# Patient Record
Sex: Male | Born: 1960
Health system: Southern US, Community
[De-identification: ages and names within clinical notes are randomized; demographics above are authoritative.]

## PROBLEM LIST (undated history)

## (undated) DIAGNOSIS — E785 Hyperlipidemia, unspecified: Secondary | ICD-10-CM

## (undated) DIAGNOSIS — G2581 Restless legs syndrome: Secondary | ICD-10-CM

## (undated) DIAGNOSIS — I10 Essential (primary) hypertension: Secondary | ICD-10-CM

## (undated) DIAGNOSIS — E782 Mixed hyperlipidemia: Secondary | ICD-10-CM

## (undated) DIAGNOSIS — J342 Deviated nasal septum: Secondary | ICD-10-CM

## (undated) DIAGNOSIS — R51 Headache: Secondary | ICD-10-CM

## (undated) DIAGNOSIS — G4700 Insomnia, unspecified: Secondary | ICD-10-CM

## (undated) DIAGNOSIS — R351 Nocturia: Secondary | ICD-10-CM

## (undated) DIAGNOSIS — J329 Chronic sinusitis, unspecified: Secondary | ICD-10-CM

## (undated) DIAGNOSIS — E669 Obesity, unspecified: Secondary | ICD-10-CM

## (undated) DIAGNOSIS — F329 Major depressive disorder, single episode, unspecified: Secondary | ICD-10-CM

## (undated) DIAGNOSIS — R519 Headache, unspecified: Secondary | ICD-10-CM

## (undated) DIAGNOSIS — G4730 Sleep apnea, unspecified: Secondary | ICD-10-CM

## (undated) DIAGNOSIS — F32A Depression, unspecified: Secondary | ICD-10-CM

## (undated) DIAGNOSIS — K219 Gastro-esophageal reflux disease without esophagitis: Secondary | ICD-10-CM

## (undated) HISTORY — DX: Headache: R51

## (undated) HISTORY — PX: SPINE SURGERY: SHX786

## (undated) HISTORY — DX: Headache, unspecified: R51.9

## (undated) HISTORY — DX: Depression, unspecified: F32.A

## (undated) HISTORY — DX: Chronic sinusitis, unspecified: J32.9

## (undated) HISTORY — DX: Essential (primary) hypertension: I10

## (undated) HISTORY — DX: Gastro-esophageal reflux disease without esophagitis: K21.9

## (undated) HISTORY — PX: NASAL SINUS SURGERY: SHX719

## (undated) HISTORY — DX: Sleep apnea, unspecified: G47.30

## (undated) HISTORY — DX: Deviated nasal septum: J34.2

## (undated) HISTORY — DX: Obesity, unspecified: E66.9

## (undated) HISTORY — DX: Mixed hyperlipidemia: E78.2

## (undated) HISTORY — PX: CHOLECYSTECTOMY, LAPAROSCOPIC: SHX56

## (undated) HISTORY — PX: SEPTOPLASTY: SUR1290

## (undated) HISTORY — PX: WISDOM TOOTH EXTRACTION: SHX21

## (undated) HISTORY — DX: Restless legs syndrome: G25.81

## (undated) HISTORY — DX: Nocturia: R35.1

## (undated) HISTORY — PX: BACK SURGERY: SHX140

## (undated) HISTORY — DX: Hyperlipidemia, unspecified: E78.5

## (undated) HISTORY — DX: Insomnia, unspecified: G47.00

---
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
## 2014-05-12 DIAGNOSIS — M545 Low back pain, unspecified: Secondary | ICD-10-CM | POA: Insufficient documentation

## 2014-05-12 DIAGNOSIS — I10 Essential (primary) hypertension: Secondary | ICD-10-CM | POA: Insufficient documentation

## 2014-06-19 ENCOUNTER — Encounter: Payer: Self-pay | Admitting: *Deleted

## 2014-06-19 ENCOUNTER — Encounter: Payer: Self-pay | Admitting: Neurology

## 2014-06-19 ENCOUNTER — Ambulatory Visit (INDEPENDENT_AMBULATORY_CARE_PROVIDER_SITE_OTHER): Payer: Federal, State, Local not specified - PPO | Admitting: Neurology

## 2014-06-19 VITALS — BP 134/95 | HR 73 | Ht 73.25 in | Wt 248.0 lb

## 2014-06-19 DIAGNOSIS — T3995XA Adverse effect of unspecified nonopioid analgesic, antipyretic and antirheumatic, initial encounter: Secondary | ICD-10-CM

## 2014-06-19 DIAGNOSIS — G43709 Chronic migraine without aura, not intractable, without status migrainosus: Secondary | ICD-10-CM | POA: Insufficient documentation

## 2014-06-19 DIAGNOSIS — G4730 Sleep apnea, unspecified: Secondary | ICD-10-CM

## 2014-06-19 DIAGNOSIS — G4440 Drug-induced headache, not elsewhere classified, not intractable: Secondary | ICD-10-CM | POA: Insufficient documentation

## 2014-06-19 DIAGNOSIS — R51 Headache: Secondary | ICD-10-CM

## 2014-06-19 DIAGNOSIS — R519 Headache, unspecified: Secondary | ICD-10-CM | POA: Insufficient documentation

## 2014-06-19 MED ORDER — DIVALPROEX SODIUM ER 500 MG PO TB24: 500.0000 mg | ORAL_TABLET | Freq: Every day | ORAL | Status: DC

## 2014-06-19 MED ORDER — TRAMADOL HCL 50 MG PO TABS: 100.0000 mg | ORAL_TABLET | Freq: Four times a day (QID) | ORAL | Status: DC | PRN

## 2014-06-19 NOTE — Patient Instructions (Addendum)
I had a long discussion with the patient regarding his chronic daily headaches and we discussed ianalgesic rebound headache and I recommend that he discontinue Goody powder, migraine Excedrin, Tylenol and all over-the-counter 96. Start Depakote ER 500 mg daily for headache prophylaxis and tramadol 100 mg every 6 hourly as needed for symptomatic relief but limited to not more than 3 days per week.I have also advised him to do regular neck stretching exercises and to participate in regular activities for stress relaxation. Check MRI scan of the brain and MRA of the brain for structural or vascular causes and polysomnogram for sleep apnea. Return for follow-up in 2 months or call earlier if necessary  Analgesic Rebound Headaches An analgesic rebound headache is a headache that returns after pain medicine (analgesic) that was taken to treat the initial headache wears off. People who suffer from tension, migraine, or cluster headaches are at risk for developing rebound headaches. Any type of primary headache can return as a rebound headache if you regularly take analgesics more than three times a week. If the cycle of rebound headaches continues, they become chronic daily headaches.  CAUSES Analgesics frequently associated with this problem include common over-the-counter medicines like aspirin, ibuprofen, acetaminophen, sinus relief medicines, and other medicines that contain caffeine. Narcotic pain medicines are also a common cause of rebound headaches.  SIGNS AND SYMPTOMS The symptoms of rebound headaches are the same as the symptoms of your initial headache. Symptoms of specific types of headaches include: Tension headache  Pressure around the head.  Dull, aching head pain.  Pain felt over the front and sides of the head.  Tenderness in the muscles of the head, neck and shoulders. Migraine Headache  Pulsing or throbbing pain on one or both sides of the head.  Severe pain that interferes with  daily activities.  Pain that is worsened by physical activity.  Nausea, vomiting, or both.  Pain with exposure to bright light, loud noises, or strong smells.  General sensitivity to bright light, loud noises, or strong smells.  Visual changes.  Numbness of one or both arms. Cluster Headaches  Severe pain that begins in or around one eye or temple.  Redness in the eye on the same side as the pain.  Droopy or swollen eyelid.  One-sided head pain.  Nausea.  Runny nose.  Sweaty, pale facial skin.  Restlessness. DIAGNOSIS  Analgesic rebound headaches are diagnosed by reviewing your medical history. This includes the nature of your initial headaches, as well as the type of pain medicines you have been using to treat your headaches and how often you take them. TREATMENT Discontinuing frequent use of the analgesic medicine will typically reduce the frequency of the rebound episodes. This may initially worsen your headaches but eventually the pain should become more manageable, less frequent, and less severe.  Seeing a headache specialists may helpful. He or she may be able to help you manage your headaches and to make sure there is not another cause of the headaches. Alternative methods of stress relief such as acupuncture, counseling, biofeedback, and massage may also be helpful. Talk with your health care provider about which alternative treatments might be good for you. HOME CARE INSTRUCTIONS Stopping the regular use of pain medicine can be difficult. Follow your health care provider's instructions carefully. Keep all of your appointments. Avoid triggers that are known to cause your primary headaches. SEEK MEDICAL CARE IF: You continue to experience headaches after following your health care provider's recommended treatments. SEEK IMMEDIATE  MEDICAL CARE IF:  You develop new headache pain.  You develop headache pain that is different than what you have experienced in the  past.  You develop numbness or tingling in your arms or legs.  You develop changes in your speech or vision. MAKE SURE YOU:  Understand these instructions.  Will watch your child's condition.  Will get help right away if your child is not doing well or gets worse. Document Released: 10/14/2003 Document Revised: 12/08/2013 Document Reviewed: 02/06/2013 [facility] Patient Information 2015 [place], [place]. This information is not intended to replace advice given to you by your health care provider. Make sure you discuss any questions you have with your health care provider.

## 2014-06-19 NOTE — Progress Notes (Signed)
[facility] [address] [place]. [place] [id number] [phone]       OFFICE CONSULT NOTE  Mr. Bryan DysDavid R Ochoa Date of Birth:  1960/10/21 Medical Record Number:  [id number]   Referring MD:  Starleen ArmsJyoti Mann  Reason for Referral:  headaches  HPI: [id number] year Caucasian male who states he is having headaches most of his life. He can remember schooldays when he is to throw up feels sick for most of the day and use to miss a lot of school and stay at home. He states his family was poor and they could not afford to see a doctor. He describes headache as being stereotypical, severe in intensity,bitemporal, sharp throbbing a and ccompanied by nausea or vomiting. Headaches are relieved by lying down and sleeping and get worse with physical activity. He rarely sees spots in front of his eyes. He's had daily headaches for rales several years as far back as he can remember. He takes 5-7 tablets of Goody powders every day as well as 2-3 tablets of Tylenol for migraine Excedrin on most days. These relieved the headache only partially but it does not completely go away. He has never visited an urgent care or emergency room or seen a neurologist pain specialist for his headaches. He denies any family history of migraines. He denies focal neurological symptoms accompanying his headaches. He initially thought his headaches were due to sinus infection and in fact underwent sinus surgery 2 years ago which helped his allergies but has not helped his headaches. He has not undergone any brain imaging study or tried to counsel her other migraine specific drugs. He has never been on medications for headache prophylaxis. He does snore and states that he has restless sleep and is tired during the day. He has not been evaluated for sleep apnea.  ROS:   14 system review of systems is positive for headache, snoring, restless sleep, allergies, runny nose, anxiety, not enough sleep, sleepiness, insomnia  PMH:  Past  Medical History  Diagnosis Date  . HA (headache)   . Hypertension   . Insomnia   . Sinusitis     Social History:  History   Social History  . Marital Status: Married    Spouse Name: N/A    Number of Children: 2  . Years of Education: ASSOCIATES   Occupational History  . Not on file.   Social History Main Topics  . Smoking status: Former Smoker    Quit date: 06/12/2014  . Smokeless tobacco: Never Used  . Alcohol Use: No  . Drug Use: No  . Sexual Activity: Not on file   Other Topics Concern  . Not on file   Social History Narrative   Patient is married with 2 children.   Patient is right handed.   Patient has his Associates degree.   Patient drinks 2 cups daily.    Medications:   No current outpatient prescriptions on file prior to visit.   No current facility-administered medications on file prior to visit.    Allergies:   Allergies  Allergen Reactions  . Penicillins     Knots all over body.    Physical Exam General: obese middle-age Caucasian male, seated, in no evident distress Head: head normocephalic and atraumatic.   Neck: supple with no carotid or supraclavicular bruits Cardiovascular: regular rate and rhythm, no murmurs Musculoskeletal: no deformity Skin:  no rash/petichiae Vascular:  Normal pulses all extremities Filed Vitals:   06/19/14 1104  BP: 134/95  Pulse:  73    Neurologic Exam Mental Status: Awake and fully alert. Oriented to place and time. Recent and remote memory intact. Attention span, concentration and fund of knowledge appropriate. Mood and affect appropriate.  Cranial Nerves: Fundoscopic exam reveals sharp disc margins. Pupils equal, briskly reactive to light. Extraocular movements full without nystagmus. Visual fields full to confrontation. Hearing intact. Facial sensation intact. Face, tongue, palate moves normally and symmetrically.  Motor: Normal bulk and tone. Normal strength in all tested extremity muscles. Sensory.:  intact to touch , pinprick , position and vibratory sensation.  Coordination: Rapid alternating movements normal in all extremities. Finger-to-nose and heel-to-shin performed accurately bilaterally. Gait and Station: Arises from chair without difficulty. Stance is normal. Gait demonstrates normal stride length and balance . Able to heel, toe and tandem walk without difficulty.  Reflexes: 1+ and symmetric. Toes downgoing.     ASSESSMENT: [age] year Caucasian male with lifelong history of refractory chronic headaches likely transformed migraine headaches with analgesic rebound.    PLAN:  had a long discussion with the patient regarding his chronic daily headaches and we discussed ianalgesic rebound headache and I recommend that he discontinue Goody powder, migraine Excedrin, Tylenol and all over-the-counter 96. Start Depakote ER 500 mg daily for headache prophylaxis and tramadol 100 mg every 6 hourly as needed for symptomatic relief but limited to not more than 3 days per week. I have also advised him to do regular neck stretching exercises and to participate in regular activities for stress relaxation.Check MRI scan of the brain and MRA of the brain for structural or vascular causes and polysomnogram for sleep apnea. Return for follow-up in 2 months or call earlier if necessary   Note: This document was prepared with digital dictation and possible smart phrase technology. Any transcriptional errors that result from this process are unintentional.

## 2014-06-26 ENCOUNTER — Telehealth: Payer: Self-pay | Admitting: Neurology

## 2014-06-26 DIAGNOSIS — R0683 Snoring: Secondary | ICD-10-CM

## 2014-06-26 DIAGNOSIS — E669 Obesity, unspecified: Secondary | ICD-10-CM

## 2014-06-26 DIAGNOSIS — I639 Cerebral infarction, unspecified: Secondary | ICD-10-CM

## 2014-06-26 NOTE — Telephone Encounter (Signed)
Delia HeadyPramod Sethi, MD, refers patient for attended sleep study.  Height: 6'1.25"  Weight: 248lb  BMI: 32.48  Past Medical History:  HA (headache)    . Hypertension   . Insomnia   . Sinusitis      Sleep Symptoms: snoring, restless sleep, not enough sleep, sleepiness, insomnia   Epworth Score: Unable to reach patient received voicemail   Medication:  Cetirizine HCl (Tab) ZYRTEC 10 MG Take 10 mg by mouth daily.       Divalproex Sodium (Tablet SR 24 hr) DEPAKOTE ER 500 MG Take 1 tablet (500 mg total) by mouth daily.      Famotidine (Tab) PEPCID 20 MG Take 20 mg by mouth.      Losartan Potassium (Tab) COZAAR 50 MG Take 50 mg by mouth.      Multiple Vitamin (Tab) MULTI-VITAMINS  Take by mouth.      Omega-3 Fatty Acids (Cap) Omega-3 1000 MG Take by mouth.      Sertraline HCl (Tab) ZOLOFT 50 MG Take 50 mg by mouth.      TraMADol HCl (Tab) ULTRAM 50 MG Take 2 tablets (100 mg total) by mouth every 6 (six) hours as needed.      Ins: [employer]   Assessment & Plan:  [age] year Caucasian male with lifelong history of refractory chronic headaches likely transformed migraine headaches with analgesic rebound.    PLAN: had a long discussion with the patient regarding his chronic daily headaches and we discussed ianalgesic rebound headache and I recommend that he discontinue Goody powder, migraine Excedrin, Tylenol and all over-the-counter 96. Start Depakote ER 500 mg daily for headache prophylaxis and tramadol 100 mg every 6 hourly as needed for symptomatic relief but limited to not more than 3 days per week. I have also advised him to do regular neck stretching exercises and to participate in regular activities for stress relaxation.Check MRI scan of the brain and MRA of the brain for structural or vascular causes and polysomnogram for sleep apnea. Return for follow-up in 2 months or call earlier if necessary   Note: This document was prepared with digital dictation  and possible smart phrase technology. Any transcriptional errors that result from this process are unintentional.  Please review patient information and submit instructions for scheduling and orders for sleep technologist. Thank you.

## 2014-07-01 ENCOUNTER — Telehealth: Payer: Self-pay | Admitting: Neurology

## 2014-07-01 ENCOUNTER — Ambulatory Visit (INDEPENDENT_AMBULATORY_CARE_PROVIDER_SITE_OTHER): Payer: Self-pay

## 2014-07-01 DIAGNOSIS — R519 Headache, unspecified: Secondary | ICD-10-CM

## 2014-07-01 DIAGNOSIS — R51 Headache: Principal | ICD-10-CM

## 2014-07-01 DIAGNOSIS — Z0289 Encounter for other administrative examinations: Secondary | ICD-10-CM

## 2014-07-01 NOTE — Telephone Encounter (Signed)
pt had to r/s mri due to needing a metal screening for past metal in eyes, please order xray orbits for pt, he will be going to [facility] tues 07/07/14 for xray

## 2014-07-03 ENCOUNTER — Other Ambulatory Visit: Payer: Self-pay | Admitting: Neurology

## 2014-07-03 DIAGNOSIS — S0540XA Penetrating wound of orbit with or without foreign body, unspecified eye, initial encounter: Secondary | ICD-10-CM

## 2014-07-03 NOTE — Telephone Encounter (Signed)
Will order.

## 2014-07-08 ENCOUNTER — Other Ambulatory Visit: Payer: Self-pay

## 2014-07-08 ENCOUNTER — Ambulatory Visit
Admission: RE | Admit: 2014-07-08 | Discharge: 2014-07-08 | Disposition: A | Discharge status: Home / Self Care | Payer: Federal, State, Local not specified - PPO | Source: Ambulatory Visit | Attending: Neurology | Admitting: Neurology

## 2014-07-08 ENCOUNTER — Ambulatory Visit (INDEPENDENT_AMBULATORY_CARE_PROVIDER_SITE_OTHER): Payer: Federal, State, Local not specified - PPO

## 2014-07-08 ENCOUNTER — Other Ambulatory Visit: Payer: Self-pay | Admitting: Neurology

## 2014-07-08 DIAGNOSIS — Z77018 Contact with and (suspected) exposure to other hazardous metals: Secondary | ICD-10-CM

## 2014-07-08 DIAGNOSIS — R51 Headache: Secondary | ICD-10-CM

## 2014-07-08 MED ORDER — GADOPENTETATE DIMEGLUMINE 469.01 MG/ML IV SOLN: 20.0000 mL | Freq: Once | INTRAVENOUS | Status: AC | PRN

## 2014-07-15 ENCOUNTER — Encounter: Payer: Self-pay | Admitting: *Deleted

## 2014-09-03 ENCOUNTER — Other Ambulatory Visit: Payer: Self-pay | Admitting: Neurology

## 2014-09-11 ENCOUNTER — Telehealth: Payer: Self-pay | Admitting: *Deleted

## 2014-09-11 NOTE — Telephone Encounter (Signed)
Called patient and left voice message for patient to call back and r/s appointment for 09/23/14.

## 2014-09-14 ENCOUNTER — Telehealth: Payer: Self-pay | Admitting: Neurology

## 2014-09-14 NOTE — Telephone Encounter (Signed)
Pt was scheduled for a sleep study in January and canceled his appt via the automated system.  Despite our many attempts to call the patient and attempt to reschedule, he has not responded to phone calls nor messages left on voicemail for scheduling.  Will close this referral at this time.  The patient was advised that the referral could be reopened if he contacts our office.  The referring provider was notified.

## 2014-09-21 ENCOUNTER — Other Ambulatory Visit: Payer: Self-pay

## 2014-09-21 MED ORDER — TRAMADOL HCL 50 MG PO TABS: 100.0000 mg | ORAL_TABLET | Freq: Four times a day (QID) | ORAL | Status: DC | PRN

## 2014-09-22 ENCOUNTER — Ambulatory Visit (INDEPENDENT_AMBULATORY_CARE_PROVIDER_SITE_OTHER): Payer: Federal, State, Local not specified - PPO | Admitting: Neurology

## 2014-09-22 DIAGNOSIS — G479 Sleep disorder, unspecified: Secondary | ICD-10-CM

## 2014-09-22 DIAGNOSIS — I639 Cerebral infarction, unspecified: Secondary | ICD-10-CM

## 2014-09-22 DIAGNOSIS — R0683 Snoring: Secondary | ICD-10-CM

## 2014-09-22 DIAGNOSIS — E669 Obesity, unspecified: Secondary | ICD-10-CM

## 2014-09-22 NOTE — Sleep Study (Signed)
Please see the scanned sleep study interpretation located in the Media tab within the Chart Review section. °

## 2014-09-23 ENCOUNTER — Ambulatory Visit: Payer: Federal, State, Local not specified - PPO | Admitting: Neurology

## 2014-09-26 ENCOUNTER — Other Ambulatory Visit: Payer: Self-pay | Admitting: Neurology

## 2014-09-29 DIAGNOSIS — I639 Cerebral infarction, unspecified: Secondary | ICD-10-CM | POA: Insufficient documentation

## 2014-09-29 DIAGNOSIS — R0683 Snoring: Secondary | ICD-10-CM | POA: Insufficient documentation

## 2014-09-29 DIAGNOSIS — E669 Obesity, unspecified: Secondary | ICD-10-CM | POA: Insufficient documentation

## 2014-09-30 ENCOUNTER — Telehealth: Payer: Self-pay | Admitting: *Deleted

## 2014-09-30 ENCOUNTER — Encounter: Payer: Self-pay | Admitting: Neurology

## 2014-09-30 ENCOUNTER — Other Ambulatory Visit: Payer: Self-pay | Admitting: Neurology

## 2014-09-30 DIAGNOSIS — G4733 Obstructive sleep apnea (adult) (pediatric): Secondary | ICD-10-CM

## 2014-09-30 NOTE — Telephone Encounter (Signed)
The patient's wife returned my phone call and was provided the results of her patient's sleep study that revealed severe obstructive sleep apnea.  She was informed that treatment in the form of CPAP therapy was recommended for treatment.  She was in agreement and was [facility] for CP[facility] set up.  Patient is out of town until April 1st.  Dr. Delia HeadyPramod Sethi was routed a copy of the results.  The patient gave verbal permission to mail a copy of his test results.   Patient instructed to contact our office 6-8 weeks post set up to schedule a follow up appointment.

## 2014-10-01 ENCOUNTER — Encounter: Payer: Self-pay | Admitting: *Deleted

## 2014-10-05 NOTE — Telephone Encounter (Signed)
Patient called back and r/s to 12/16/14 at 2:30 pm.

## 2014-11-16 ENCOUNTER — Telehealth: Payer: Self-pay | Admitting: *Deleted

## 2014-11-16 NOTE — Telephone Encounter (Signed)
Patient has a recent diagnosis of OSA.  Was referred to [facility] for CPAP set up.  He has refused treatment per [facility].

## 2014-12-04 NOTE — Telephone Encounter (Signed)
We have a lot of [facility] patient refusing CPAP- is that cost related"/ wold be cheaper at another DME ?

## 2014-12-16 ENCOUNTER — Ambulatory Visit: Payer: Federal, State, Local not specified - PPO | Admitting: Neurology

## 2015-03-23 ENCOUNTER — Encounter: Payer: Self-pay | Admitting: Neurology

## 2015-03-23 ENCOUNTER — Ambulatory Visit (INDEPENDENT_AMBULATORY_CARE_PROVIDER_SITE_OTHER): Payer: Federal, State, Local not specified - PPO | Admitting: Neurology

## 2015-03-23 VITALS — BP 135/92 | HR 69 | Ht 72.0 in | Wt 259.8 lb

## 2015-03-23 DIAGNOSIS — G441 Vascular headache, not elsewhere classified: Secondary | ICD-10-CM

## 2015-03-23 MED ORDER — SERTRALINE HCL 100 MG PO TABS: 100.0000 mg | ORAL_TABLET | Freq: Every day | ORAL | Status: DC

## 2015-03-23 NOTE — Progress Notes (Signed)
[facility] [address] [place]. [place] [address] [phone]       OFFICE FOLLOW UP VISIT  NOTE  Bryan Ochoa Date of Birth:  [birth year] Medical Record Number:  [id number]   Referring MD:  Starleen Arms  Reason for Referral:  headaches  HPI: 37 year Caucasian male who states he is having headaches most of his life. He can remember schooldays when he is to throw up feels sick for most of the day and use to miss a lot of school and stay at home. He states his family was poor and they could not afford to see a doctor. He describes headache as being stereotypical, severe in intensity,bitemporal, sharp throbbing a and ccompanied by nausea or vomiting. Headaches are relieved by lying down and sleeping and get worse with physical activity. He rarely sees spots in front of his eyes. He's had daily headaches for rales several years as far back as he can remember. He takes 5-7 tablets of Goody powders every day as well as 2-3 tablets of Tylenol for migraine Excedrin on most days. These relieved the headache only partially but it does not completely go away. He has never visited an urgent care or emergency room or seen a neurologist pain specialist for his headaches. He denies any family history of migraines. He denies focal neurological symptoms accompanying his headaches. He initially thought his headaches were due to sinus infection and in fact underwent sinus surgery 2 years ago which helped his allergies but has not helped his headaches. He has not undergone any brain imaging study or tried to counsel her other migraine specific drugs. He has never been on medications for headache prophylaxis. He does snore and states that he has restless sleep and is tired during the day. He has not been evaluated for sleep apnea. Update 03/23/2015 : He returns for follow-up after initial consultation with me in November 2015. He reported significant improvement in his headaches after starting  Depakote and discontinuing his over-the-counter 106. The headaches are not down to 1 or 2 per week maximum. He takes tramadol which helps. He is taking Depakote ER 500 mg once a day. He underwent MRI scan of the brain on 07/08/14 which I personally reviewed which was unremarkable except for mild changes of  paranasal sinusitis. MRA of the brain showed no significant large vessel stenosis or aneurysms. Polysomnogram sleep study on 09/22/14 showed severe sleep apnea for which he has been started on CPAP. He was initially reluctant to use it but states her last several months he has been compliant. He has not been doing regular neck exercises and I have advised him. Patient has a long-standing history of anxiety and did well on Zoloft 50 mg for several years but in recent months he has noticed increased anxiety as well as agitation and even road rage. He is suggesting a dose increase might help him. ROS:   14 system review of systems is positive for headache,  bladder urgency, decreased urination, back pain, insomnia, apnea, agitation, anxiety, nervousness and all other systems negative PMH:  Past Medical History  Diagnosis Date  . HA (headache)   . Hypertension   . Insomnia   . Sinusitis     Social History:  Social History   Social History  . Marital Status: Married    Spouse Name: N/A  . Number of Children: 2  . Years of Education: ASSOCIATES   Occupational History  . Not on file.   Social  History Main Topics  . Smoking status: Former Smoker    Quit date: 06/12/2014  . Smokeless tobacco: Never Used  . Alcohol Use: No  . Drug Use: No  . Sexual Activity: Not on file   Other Topics Concern  . Not on file   Social History Narrative   Patient is married with 2 children.   Patient is right handed.   Patient has his Associates degree.   Patient drinks 2 cups daily.    Medications:   Current Outpatient Prescriptions on File Prior to Visit  Medication Sig Dispense Refill  .  cetirizine (ZYRTEC) 10 MG tablet Take 10 mg by mouth daily.    . divalproex (DEPAKOTE ER) 500 MG 24 hr tablet TAKE 1 TABLET BY MOUTH EVERY DAY 90 tablet 1  . famotidine (PEPCID) 20 MG tablet Take 20 mg by mouth.    . losartan (COZAAR) 50 MG tablet Take 50 mg by mouth.    . Multiple Vitamin (MULTI-VITAMINS) TABS Take by mouth.    . Omega-3 1000 MG CAPS Take by mouth.    . traMADol (ULTRAM) 50 MG tablet Take 2 tablets (100 mg total) by mouth every 6 (six) hours as needed. 30 tablet 1   No current facility-administered medications on file prior to visit.    Allergies:   Allergies  Allergen Reactions  . Penicillins     Knots all over body.    Physical Exam General: obese middle-age Caucasian male, seated, in no evident distress Head: head normocephalic and atraumatic.   Neck: supple with no carotid or supraclavicular bruits Cardiovascular: regular rate and rhythm, no murmurs Musculoskeletal: no deformity Skin:  no rash/petichiae Vascular:  Normal pulses all extremities Filed Vitals:   03/23/15 1456  BP: 135/92  Pulse: 69    Neurologic Exam Mental Status: Awake and fully alert. Oriented to place and time. Recent and remote memory intact. Attention span, concentration and fund of knowledge appropriate. Mood and affect appropriate.  Cranial Nerves: Fundoscopic exam not done .Marland Kitchen Pupils equal, briskly reactive to light. Extraocular movements full without nystagmus. Visual fields full to confrontation. Hearing intact. Facial sensation intact. Face, tongue, palate moves normally and symmetrically.  Motor: Normal bulk and tone. Normal strength in all tested extremity muscles. Sensory.: intact to touch , pinprick , position and vibratory sensation.  Coordination: Rapid alternating movements normal in all extremities. Finger-to-nose and heel-to-shin performed accurately bilaterally. Gait and Station: Arises from chair without difficulty. Stance is normal. Gait demonstrates normal stride length  and balance . Able to heel, toe and tandem walk without difficulty.  Reflexes: 1+ and symmetric. Toes downgoing.     ASSESSMENT: 89 year Caucasian male with lifelong history of refractory chronic headaches likely transformed migraine headaches with analgesic rebound who is doing a lot better on current regimen of Depakote and tramadol. Recent increase in anxiety symptoms.    PLAN:  I had a long discussion with the patient with regards to his headaches which appears significantly improved on the current medication regimen of Depakote ER 500 mg daily and tramadol for symptomatic relief. I personally reviewed MRI scan images and sleep study results with the patient and answered questions. I recommend he continued the present treatment plan as well as be compliant with his CPAP to treat his sleep apnea. Patient is complaining of increasing symptoms of anxiety and hence I recommend increasing Zoloft to 100 mg daily. He was advised to follow-up with his primary physician and seek further medication changes for anxiety if needed.  He was advised to return for follow-up in 3 months with Butch Penny, nurse practitioner or call earlier if necessary.  Delia Heady, MD  Note: This document was prepared with digital dictation and possible smart phrase technology. Any transcriptional errors that result from this process are unintentional.

## 2015-03-23 NOTE — Patient Instructions (Addendum)
I had a long discussion with the patient with regards to his headaches which appears significantly improved on the current medication regimen of Depakote ER 500 mg daily and tramadol for symptomatic relief. I personally reviewed MRI scan images and sleep study results with the patient and answered questions. I recommend he continued the present treatment plan as well as be compliant with his CPAP to treat his sleep apnea. Patient is complaining of increasing symptoms of anxiety and hence I recommend increasing Zoloft to 100 mg daily. He was advised to follow-up with his primary physician and seek further medication changes for anxiety if needed. He was advised to return for follow-up in 3 months with Butch Penny, nurse practitioner or call earlier if necessary.

## 2015-05-18 ENCOUNTER — Telehealth: Payer: Self-pay

## 2015-05-18 ENCOUNTER — Other Ambulatory Visit: Payer: Self-pay | Admitting: Neurology

## 2015-05-18 NOTE — Telephone Encounter (Signed)
Rx signed and faxed.

## 2015-05-18 NOTE — Telephone Encounter (Signed)
Rn call patient about his tramadol being ready at the pharmacy. Pts wife Stanton Kidney answer the phone who is on the [facility] form. Stanton Kidney stated they will pick up medication for his neadaches.

## 2015-05-26 ENCOUNTER — Other Ambulatory Visit: Payer: Self-pay | Admitting: Neurology

## 2015-06-23 ENCOUNTER — Ambulatory Visit: Payer: Federal, State, Local not specified - PPO | Admitting: Adult Health

## 2015-06-29 ENCOUNTER — Ambulatory Visit: Payer: Federal, State, Local not specified - PPO | Admitting: Adult Health

## 2015-06-30 ENCOUNTER — Encounter: Payer: Self-pay | Admitting: Adult Health

## 2015-07-06 ENCOUNTER — Ambulatory Visit: Payer: Federal, State, Local not specified - PPO | Admitting: Adult Health

## 2015-07-07 ENCOUNTER — Encounter: Payer: Self-pay | Admitting: Adult Health

## 2015-07-21 ENCOUNTER — Other Ambulatory Visit: Payer: Self-pay | Admitting: Neurology

## 2015-10-26 DIAGNOSIS — F329 Major depressive disorder, single episode, unspecified: Secondary | ICD-10-CM | POA: Insufficient documentation

## 2015-10-26 DIAGNOSIS — F339 Major depressive disorder, recurrent, unspecified: Secondary | ICD-10-CM | POA: Insufficient documentation

## 2015-10-26 DIAGNOSIS — K219 Gastro-esophageal reflux disease without esophagitis: Secondary | ICD-10-CM | POA: Insufficient documentation

## 2015-10-26 DIAGNOSIS — E782 Mixed hyperlipidemia: Secondary | ICD-10-CM | POA: Insufficient documentation

## 2015-10-26 DIAGNOSIS — F32A Depression, unspecified: Secondary | ICD-10-CM | POA: Insufficient documentation

## 2015-11-05 ENCOUNTER — Other Ambulatory Visit: Payer: Self-pay | Admitting: Neurology

## 2015-11-22 ENCOUNTER — Other Ambulatory Visit: Payer: Self-pay | Admitting: Neurology

## 2015-11-22 ENCOUNTER — Encounter: Payer: Self-pay | Admitting: *Deleted

## 2015-12-19 ENCOUNTER — Other Ambulatory Visit: Payer: Self-pay | Admitting: Neurology

## 2016-01-13 ENCOUNTER — Other Ambulatory Visit: Payer: Self-pay | Admitting: Neurology

## 2016-05-10 ENCOUNTER — Ambulatory Visit (INDEPENDENT_AMBULATORY_CARE_PROVIDER_SITE_OTHER): Payer: Federal, State, Local not specified - PPO

## 2016-05-10 ENCOUNTER — Ambulatory Visit (INDEPENDENT_AMBULATORY_CARE_PROVIDER_SITE_OTHER): Payer: Federal, State, Local not specified - PPO | Admitting: Podiatry

## 2016-05-10 ENCOUNTER — Encounter: Payer: Self-pay | Admitting: Podiatry

## 2016-05-10 VITALS — BP 138/84 | HR 71 | Resp 16 | Ht 72.0 in | Wt 250.0 lb

## 2016-05-10 DIAGNOSIS — M79672 Pain in left foot: Secondary | ICD-10-CM | POA: Diagnosis not present

## 2016-05-10 DIAGNOSIS — M722 Plantar fascial fibromatosis: Secondary | ICD-10-CM

## 2016-05-10 DIAGNOSIS — M79671 Pain in right foot: Secondary | ICD-10-CM

## 2016-05-10 MED ORDER — PREDNISONE 10 MG PO TABS: ORAL_TABLET | ORAL | 0 refills | Status: DC

## 2016-05-10 MED ORDER — TRIAMCINOLONE ACETONIDE 10 MG/ML IJ SUSP
10.0000 mg | Freq: Once | INTRAMUSCULAR | Status: AC
  Administered 2016-05-10: 10 mg

## 2016-05-10 NOTE — Progress Notes (Signed)
   Subjective:    Patient ID: Bryan Ochoa, male    DOB: May 22, 1961, 55 y.o.   MRN: [id number]  HPI  Chief Complaint  Patient presents with  . Foot Pain    R heel x 2 wks.         Review of Systems  All other systems reviewed and are negative.      Objective:   Physical Exam        Assessment & Plan:

## 2016-05-11 NOTE — Progress Notes (Signed)
Subjective:     Patient ID: Bryan Ochoa, male   DOB: 09-15-1960, 55 y.o.   MRN: [id number]  HPI patient presents stating he's developed exquisite discomfort in the right plantar heel at the insertional point of the tendon into the calcaneus with inflammation fluid around the medial band. States he has trouble bearing weight on it   Review of Systems  All other systems reviewed and are negative.      Objective:   Physical Exam  Constitutional: He is oriented to person, place, and time.  Cardiovascular: Intact distal pulses.   Musculoskeletal: Normal range of motion.  Neurological: He is oriented to person, place, and time.  Skin: Skin is warm.  Nursing note and vitals reviewed.  neurovascular status intact muscle strength adequate with inflammation and pain around the right plantar heel medial side with fluid buildup around the tendon insertion. Patient has good digital perfusion and is well oriented 3     Assessment:     Acute plantar fasciitis right with inflammation fluid around the medial band    Plan:     H&P and condition reviewed with patient. Injected the medial fascia 3 mg Kenalog 5 mg Xylocaine and applied fascial brace gave instructions on physical therapy and anti-inflammatory usage  X-ray report indicates there is spur formation with no indications of stress fracture arthritis

## 2016-05-24 ENCOUNTER — Ambulatory Visit: Payer: Federal, State, Local not specified - PPO | Admitting: Podiatry

## 2016-06-14 ENCOUNTER — Ambulatory Visit (INDEPENDENT_AMBULATORY_CARE_PROVIDER_SITE_OTHER): Payer: Federal, State, Local not specified - PPO

## 2016-06-14 ENCOUNTER — Encounter: Payer: Self-pay | Admitting: Podiatry

## 2016-06-14 ENCOUNTER — Ambulatory Visit (INDEPENDENT_AMBULATORY_CARE_PROVIDER_SITE_OTHER): Payer: Federal, State, Local not specified - PPO | Admitting: Podiatry

## 2016-06-14 DIAGNOSIS — M25571 Pain in right ankle and joints of right foot: Secondary | ICD-10-CM | POA: Diagnosis not present

## 2016-06-14 DIAGNOSIS — M779 Enthesopathy, unspecified: Secondary | ICD-10-CM

## 2016-06-14 DIAGNOSIS — S93401A Sprain of unspecified ligament of right ankle, initial encounter: Secondary | ICD-10-CM | POA: Diagnosis not present

## 2016-06-14 MED ORDER — TRIAMCINOLONE ACETONIDE 10 MG/ML IJ SUSP
10.0000 mg | Freq: Once | INTRAMUSCULAR | Status: AC
  Administered 2016-06-14: 10 mg

## 2016-06-14 NOTE — Progress Notes (Signed)
Subjective:     Patient ID: Bryan Ochoa, male   DOB: [birth year], 55 y.o.   MRN: [id number]  HPI patient states that his heel is feeling quite a bit better but he did develop inflammation in the outside of the right ankle and sprain the ankle with discomfort and swelling and was concerned about fracture. States it hurts deep in the joint and also in the lateral ligaments and feels unstable and the lateral ligaments   Review of Systems     Objective:   Physical Exam Neurovascular status intact with significant diminishment of discomfort plantar heel right with the sinus tarsi right being quite inflamed and discomfort in the lateral ankle ligaments anterior talofibular and calcaneofibular with no indication of tearing.    Assessment:     Inflammatory changes with capsulitis of the sinus tarsi and ankle injury right    Plan:     H&P ankle x-rays reviewed and injected the sinus tarsi today 3 Pershing Pr[place] problems like it to reduce inflammatory processes deep within the joint. I then applied a Tri-Lock ankle brace in order to provide complete support around the ankle  X-ray report indicates no indications of fracture or diastases injury

## 2016-09-05 DIAGNOSIS — J329 Chronic sinusitis, unspecified: Secondary | ICD-10-CM | POA: Insufficient documentation

## 2016-09-13 ENCOUNTER — Encounter: Payer: Self-pay | Admitting: Podiatry

## 2016-09-13 ENCOUNTER — Ambulatory Visit (INDEPENDENT_AMBULATORY_CARE_PROVIDER_SITE_OTHER): Payer: Federal, State, Local not specified - PPO | Admitting: Podiatry

## 2016-09-13 DIAGNOSIS — M659 Synovitis and tenosynovitis, unspecified: Secondary | ICD-10-CM | POA: Diagnosis not present

## 2016-09-13 DIAGNOSIS — M7751 Other enthesopathy of right foot: Secondary | ICD-10-CM

## 2016-09-13 DIAGNOSIS — M722 Plantar fascial fibromatosis: Secondary | ICD-10-CM

## 2016-09-13 DIAGNOSIS — M79671 Pain in right foot: Secondary | ICD-10-CM | POA: Diagnosis not present

## 2016-09-13 DIAGNOSIS — M25571 Pain in right ankle and joints of right foot: Secondary | ICD-10-CM | POA: Diagnosis not present

## 2016-09-13 MED ORDER — NONFORMULARY OR COMPOUNDED ITEM: 2 refills | Status: DC

## 2016-09-24 MED ORDER — BETAMETHASONE SOD PHOS & ACET 6 (3-3) MG/ML IJ SUSP: 3.0000 mg | Freq: Once | INTRAMUSCULAR | Status: DC

## 2016-09-24 NOTE — Progress Notes (Signed)
   Subjective:  Patient presents today for follow-up evaluation of heel pain which is been going on since his last visit. Patient was last seen by Dr. Charlsie Merlesregal on 06/14/2016. Patient does state that the injection he received on the last visit didn't help however he continues to be in pain regarding his right foot and ankle.    Objective/Physical Exam General: The patient is alert and oriented x3 in no acute distress.  Dermatology: Skin is warm, dry and supple bilateral lower extremities. Negative for open lesions or macerations.  Vascular: Palpable pedal pulses bilaterally. No edema or erythema noted. Capillary refill within normal limits.  Neurological: Epicritic and protective threshold grossly intact bilaterally.   Musculoskeletal Exam: Pain on palpation to the medial calcaneal tubercle at the insertion of the plantar fascia of the right lower extremity. Pain on palpation also noted to the anterior medial and lateral aspects of the patient's right ankle. Patient has a history of an ankle sprain to the right lower extremity. Range of motion within normal limits to all pedal and ankle joints bilateral. Muscle strength 5/5 in all groups bilateral.    Assessment: #1 right ankle pain with synovitis due to a history of ankle sprain #2 plantar fasciitis right foot   Plan of Care:  #1 Patient was evaluated. #2 injection of 0.5 mL Celestone Soluspan injected in the patient's right ankle joint #3 injection of 0.5 mL Celestone Soluspan injected into the patient's right plantar fascia the insertion of the medial calcaneal tubercle #4 prescription for anti-inflammatory pain cream dispensed through [facility] #5 today we did discuss custom molded orthotics. The patient is going check with his wife regarding Cost and deductible. #6 return to clinic in 4 weeks   Felecia ShellingBrent M. Kelby Lotspeich, DPM [facility]  Dr. Felecia ShellingBrent M. Harmoni Lucus, DPM    [address]                                         [place], [place] [address]                Office [phone]  Fax [phone](336) [phone]

## 2016-10-25 ENCOUNTER — Ambulatory Visit (INDEPENDENT_AMBULATORY_CARE_PROVIDER_SITE_OTHER): Payer: Federal, State, Local not specified - PPO | Admitting: Podiatry

## 2016-10-25 DIAGNOSIS — M659 Synovitis and tenosynovitis, unspecified: Secondary | ICD-10-CM

## 2016-10-25 DIAGNOSIS — M79673 Pain in unspecified foot: Secondary | ICD-10-CM | POA: Diagnosis not present

## 2016-10-25 DIAGNOSIS — M25571 Pain in right ankle and joints of right foot: Secondary | ICD-10-CM

## 2016-10-25 DIAGNOSIS — M722 Plantar fascial fibromatosis: Secondary | ICD-10-CM | POA: Diagnosis not present

## 2016-10-25 DIAGNOSIS — M79672 Pain in left foot: Secondary | ICD-10-CM | POA: Diagnosis not present

## 2016-10-25 DIAGNOSIS — M7751 Other enthesopathy of right foot: Secondary | ICD-10-CM | POA: Diagnosis not present

## 2016-10-25 DIAGNOSIS — M79671 Pain in right foot: Secondary | ICD-10-CM | POA: Diagnosis not present

## 2016-11-01 MED ORDER — BETAMETHASONE SOD PHOS & ACET 6 (3-3) MG/ML IJ SUSP: 3.0000 mg | Freq: Once | INTRAMUSCULAR | Status: DC

## 2016-11-01 NOTE — Progress Notes (Signed)
   Subjective: Patient presents today for follow-up evaluation of right ankle pain due to a history of ankle sprain as well as plantar fasciitis. Patient states that his ankle is much better however he still has some residual pain in his right ankle. Patient is still having heel pain bilateral. Patient presents today for follow-up treatment and evaluation  Objective: Physical Exam General: The patient is alert and oriented x3 in no acute distress.  Dermatology: Skin is warm, dry and supple bilateral lower extremities. Negative for open lesions or macerations bilateral.   Vascular: Dorsalis Pedis and Posterior Tibial pulses palpable bilateral.  Capillary fill time is immediate to all digits.  Neurological: Epicritic and protective threshold intact bilateral.   Musculoskeletal: Tenderness to palpation at the medial calcaneal tubercale and through the insertion of the plantar fascia of the bilateral feet. All other joints range of motion within normal limits bilateral. Strength 5/5 in all groups bilateral.  Pain on palpation also noted to the medial anterior and lateral aspects of the patient's right ankle joint.  Radiographic exam: Normal osseous mineralization. Joint spaces preserved. No fracture/dislocation/boney destruction. Calcaneal spur present with mild thickening of plantar fascia bilateral. No other soft tissue abnormalities or radiopaque foreign bodies.   Assessment: #1 plantar fasciitis bilateral feet #2 pain in bilateral feet #3 right ankle pain with synovitis due to history of ankle sprain  Plan of Care:  1. Patient evaluated. Xrays reviewed.   2. Injection of 0.5cc Celestone soluspan injected into the bilateral heels.  3. Injection of 0.5 mL Celestone Soluspan injected in the patient's right ankle joint 4. Today plantar fascial bands were dispensed for the bilateral plantar fascitis 2 5. Today the patient was scanned for custom molded orthotics 6. Return to clinic in 4  weeks   Felecia ShellingBrent M. Lavene Penagos, DPM [facility]  Dr. Felecia ShellingBrent M. Daymion Nazaire, DPM    [address]                                        [place], [place] [address]                Office [phone]([phone]  Fax [phone]([phone]

## 2016-11-22 ENCOUNTER — Other Ambulatory Visit: Payer: Federal, State, Local not specified - PPO

## 2016-11-22 ENCOUNTER — Encounter: Payer: Self-pay | Admitting: Podiatry

## 2016-11-22 ENCOUNTER — Ambulatory Visit (INDEPENDENT_AMBULATORY_CARE_PROVIDER_SITE_OTHER): Payer: Federal, State, Local not specified - PPO | Admitting: Podiatry

## 2016-11-22 DIAGNOSIS — M722 Plantar fascial fibromatosis: Secondary | ICD-10-CM | POA: Diagnosis not present

## 2016-11-22 DIAGNOSIS — M79671 Pain in right foot: Secondary | ICD-10-CM | POA: Diagnosis not present

## 2016-11-22 NOTE — Patient Instructions (Signed)

## 2016-11-23 NOTE — Progress Notes (Signed)
   Subjective: Patient presents today for follow-up for pain and tenderness in the right foot. He states he is still having pain in the right arch and bottom of the heel. The injection helped for for 5 days.  Objective: Physical Exam General: The patient is alert and oriented x3 in no acute distress.  Dermatology: Skin is warm, dry and supple bilateral lower extremities. Negative for open lesions or macerations bilateral.   Vascular: Dorsalis Pedis and Posterior Tibial pulses palpable bilateral.  Capillary fill time is immediate to all digits.  Neurological: Epicritic and protective threshold intact bilateral.   Musculoskeletal: Tenderness to palpation at the medial calcaneal tubercale and through the insertion of the plantar fascia of the right foot. All other joints range of motion within normal limits bilateral. Strength 5/5 in all groups bilateral.    Assessment: 1. Plantar fasciitis right 2. Pain in right foot 3. Right ankle synovitis-resolved 4. History of right ankle sprain  Plan of Care:  1. Patient evaluated. Xrays reviewed.   2. Injection of 0.5cc Celestone soluspan injected into the right heel at the insertion of the plantar fascia.  3. Instructed patient regarding therapies and modalities at home to alleviate symptoms.  4. Rx for Meloxicam  PO dispensed   5. Orthotics dispensed today with instructions provided 6. Continue wearing plantar fascial band. 7. Return to clinic in 4 weeks.     Felecia Shelling, DPM [facility]  Dr. Felecia Shelling, DPM    [address]                                        [place], [place] [address]                Office [phone]  Fax [phone]

## 2016-12-03 MED ORDER — BETAMETHASONE SOD PHOS & ACET 6 (3-3) MG/ML IJ SUSP: 3.0000 mg | Freq: Once | INTRAMUSCULAR | Status: DC

## 2016-12-20 ENCOUNTER — Ambulatory Visit (INDEPENDENT_AMBULATORY_CARE_PROVIDER_SITE_OTHER): Payer: Federal, State, Local not specified - PPO | Admitting: Podiatry

## 2016-12-20 ENCOUNTER — Encounter: Payer: Self-pay | Admitting: Podiatry

## 2016-12-20 DIAGNOSIS — M722 Plantar fascial fibromatosis: Secondary | ICD-10-CM

## 2016-12-20 MED ORDER — METHYLPREDNISOLONE 4 MG PO TBPK: ORAL_TABLET | ORAL | 0 refills | Status: DC

## 2016-12-20 MED ORDER — MELOXICAM 15 MG PO TABS: 15.0000 mg | ORAL_TABLET | Freq: Every day | ORAL | 1 refills | Status: AC

## 2016-12-21 NOTE — Progress Notes (Signed)
   Subjective: Patient presents today for follow-up evaluation of plantar fasciitis of the right foot. He reports worsening pain on the bottom of his right heel. He has not started taking the meloxicam yet. He states the injection helped for only a couple of days. He has been climbing ladders frequently at work which exacerbates the pain.  Objective: Physical Exam General: The patient is alert and oriented x3 in no acute distress.  Dermatology: Skin is warm, dry and supple bilateral lower extremities. Negative for open lesions or macerations bilateral.   Vascular: Dorsalis Pedis and Posterior Tibial pulses palpable bilateral.  Capillary fill time is immediate to all digits.  Neurological: Epicritic and protective threshold intact bilateral.   Musculoskeletal: Tenderness to palpation at the medial calcaneal tubercale and through the insertion of the plantar fascia of the right foot. All other joints range of motion within normal limits bilateral. Strength 5/5 in all groups bilateral.    Assessment: 1. Plantar fasciitis right 2. Pain in right foot   Plan of Care:  1. Patient evaluated. 2. Injection of 0.5cc Celestone soluspan injected into the right heel at the insertion of the plantar fascia.  3. Instructed patient regarding therapies and modalities at home to alleviate symptoms.  4. Rx for Meloxicam 15mg  PO dispensed   5. Prescription for Medrol Dosepak given. 6. Continue wearing plantar fasciitis brace and orthotics. 7. Return to clinic in 4 weeks.  Felecia ShellingBrent M. Octavia Mottola, DPM [facility]  Dr. Felecia ShellingBrent M. Lydon Vansickle, DPM    [address]                                        [place], [place] [address]                Office [phone]([phone]  Fax [phone](336) [phone]

## 2016-12-24 MED ORDER — BETAMETHASONE SOD PHOS & ACET 6 (3-3) MG/ML IJ SUSP: 3.0000 mg | Freq: Once | INTRAMUSCULAR | Status: DC

## 2017-01-05 ENCOUNTER — Telehealth: Payer: Self-pay | Admitting: *Deleted

## 2017-01-08 NOTE — Telephone Encounter (Signed)
Pt's wife, Stanton KidneyDebra asked for refill of the cream for pt.01/08/2017-Faxed refill request to [facility]. I informed Stanton KidneyDebra that the rx would be refilled and to get pt an appt if he continued to have pain.

## 2017-01-15 ENCOUNTER — Encounter: Payer: Self-pay | Admitting: Podiatry

## 2017-01-15 ENCOUNTER — Ambulatory Visit (INDEPENDENT_AMBULATORY_CARE_PROVIDER_SITE_OTHER): Payer: Federal, State, Local not specified - PPO | Admitting: Podiatry

## 2017-01-15 DIAGNOSIS — M79671 Pain in right foot: Secondary | ICD-10-CM | POA: Diagnosis not present

## 2017-01-15 DIAGNOSIS — M722 Plantar fascial fibromatosis: Secondary | ICD-10-CM

## 2017-01-24 NOTE — Progress Notes (Signed)
   Subjective: Patient presents today for follow-up evaluation of plantar fasciitis of the right foot. He states the right foot/heel is more painful than the left foot. He has no new complaints today. He is here for further evaluation and treatment.  Objective: Physical Exam General: The patient is alert and oriented x3 in no acute distress.  Dermatology: Skin is warm, dry and supple bilateral lower extremities. Negative for open lesions or macerations bilateral.   Vascular: Dorsalis Pedis and Posterior Tibial pulses palpable bilateral.  Capillary fill time is immediate to all digits.  Neurological: Epicritic and protective threshold intact bilateral.   Musculoskeletal: Tenderness to palpation at the medial calcaneal tubercale and through the insertion of the plantar fascia of the right foot. All other joints range of motion within normal limits bilateral. Strength 5/5 in all groups bilateral.    Assessment: 1. Plantar fasciitis right 2. Pain in right foot   Plan of Care:  1. Patient evaluated. 2. Injection of 0.5cc Celestone soluspan injected into the right heel at the insertion of the plantar fascia.  3. Continue wearing insoles, plantar fasciitis braces and taking meloxicam. 4. Return to clinic in 6 weeks. If not better, will discuss surgery.  Felecia ShellingBrent M. Zanaiya Calabria, DPM [facility]  Dr. Felecia ShellingBrent M. Briant Angelillo, DPM    [address]                                        [place], [place] [address]                Office [phone]  Fax [phone]

## 2017-01-29 MED ORDER — BETAMETHASONE SOD PHOS & ACET 6 (3-3) MG/ML IJ SUSP: 3.0000 mg | Freq: Once | INTRAMUSCULAR | Status: DC

## 2017-02-26 ENCOUNTER — Encounter: Payer: Self-pay | Admitting: Podiatry

## 2017-02-26 ENCOUNTER — Ambulatory Visit (INDEPENDENT_AMBULATORY_CARE_PROVIDER_SITE_OTHER): Payer: Federal, State, Local not specified - PPO | Admitting: Podiatry

## 2017-02-26 DIAGNOSIS — M722 Plantar fascial fibromatosis: Secondary | ICD-10-CM | POA: Diagnosis not present

## 2017-02-26 MED ORDER — MELOXICAM 15 MG PO TABS: 15.0000 mg | ORAL_TABLET | Freq: Every day | ORAL | 1 refills | Status: AC

## 2017-02-26 NOTE — Progress Notes (Signed)
   Subjective: Patient presents today for follow-up treatment and evaluation of plantar fasciitis to the right foot. Patient states that he's had the plantar fasciitis off and on for the past 5 years. Patient was last seen 01/15/2017 which time he felt much better. He states that the pain returned approximately 2 weeks ago.  Objective: Physical Exam General: The patient is alert and oriented x3 in no acute distress.  Dermatology: Skin is warm, dry and supple bilateral lower extremities. Negative for open lesions or macerations bilateral.   Vascular: Dorsalis Pedis and Posterior Tibial pulses palpable bilateral.  Capillary fill time is immediate to all digits.  Neurological: Epicritic and protective threshold intact bilateral.   Musculoskeletal: Tenderness to palpation at the medial calcaneal tubercale and through the insertion of the plantar fascia of the right foot. All other joints range of motion within normal limits bilateral. Strength 5/5 in all groups bilateral.    Assessment: 1. Plantar fasciitis right 2. Pain in right foot   Plan of Care:  1. Patient evaluated. 2. Injection of 0.5cc Celestone soluspan injected into the right heel at the insertion of the plantar fascia.  3. Continue wearing insoles, plantar fasciitis braces and taking meloxicam. 4. Refill prescription for meloxicam provided today 5. Today we also discussed additional conservative modalities and surgical modalities including is cold therapy, shockwave treatment, and ultimately surgical intervention. 6. Return to clinic in 4 weeks to reassess  Felecia ShellingBrent M. Alson Mcpheeters, DPM [facility]  Dr. Felecia ShellingBrent M. Tamotsu Wiederholt, DPM    [address]                                        [place], [place] [address]                Office [phone](336) [phone]  Fax [phone](336) [phone]

## 2017-03-26 ENCOUNTER — Ambulatory Visit (INDEPENDENT_AMBULATORY_CARE_PROVIDER_SITE_OTHER): Payer: Federal, State, Local not specified - PPO | Admitting: Podiatry

## 2017-03-26 DIAGNOSIS — M722 Plantar fascial fibromatosis: Secondary | ICD-10-CM

## 2017-03-26 MED ORDER — BETAMETHASONE SOD PHOS & ACET 6 (3-3) MG/ML IJ SUSP: 3.0000 mg | Freq: Once | INTRAMUSCULAR | Status: DC

## 2017-03-26 NOTE — Progress Notes (Signed)
   Subjective: Patient presents today for follow-up treatment and evaluation of plantar fasciitis to the right foot. Patient states that he's had the plantar fasciitis off and on for the past 5 years. Patient was last seen 02/26/2017. We have had multiple discussions regarding different modalities of treatment regarding plantar fasciitis. The patient believes he is headed in the surgical correction. He states that the injections helped significantly. Patient states that he is much better and he can finally wear orthotics.  Objective: Physical Exam General: The patient is alert and oriented x3 in no acute distress.  Dermatology: Skin is warm, dry and supple bilateral lower extremities. Negative for open lesions or macerations bilateral.   Vascular: Dorsalis Pedis and Posterior Tibial pulses palpable bilateral.  Capillary fill time is immediate to all digits.  Neurological: Epicritic and protective threshold intact bilateral.   Musculoskeletal: Tenderness to palpation at the medial calcaneal tubercale and through the insertion of the plantar fascia of the right foot. All other joints range of motion within normal limits bilateral. Strength 5/5 in all groups bilateral.   Assessment: 1. Plantar fasciitis right 2. Pain in right foot   Plan of Care:  1. Patient evaluated. 2. Injection of 0.5cc Celestone soluspan injected into the right heel at the insertion of the plantar fascia.  3. Continue custom molded insoles and good supportive shoe gear. 4. Today we discussed surgical intervention and the postoperative recovery. Patient may want to consider undergoing plantar fascial surgery before the end of the year since his deductible is met.   5. 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 Edrick Kins, [address]                                        [place], [place] [address]                 Office [phone]  Fax [phone]

## 2017-04-24 DIAGNOSIS — R351 Nocturia: Secondary | ICD-10-CM | POA: Insufficient documentation

## 2017-05-07 ENCOUNTER — Ambulatory Visit: Payer: Federal, State, Local not specified - PPO | Admitting: Podiatry

## 2017-05-21 ENCOUNTER — Encounter: Payer: Self-pay | Admitting: Podiatry

## 2017-05-21 ENCOUNTER — Ambulatory Visit (INDEPENDENT_AMBULATORY_CARE_PROVIDER_SITE_OTHER): Payer: Federal, State, Local not specified - PPO | Admitting: Podiatry

## 2017-05-21 DIAGNOSIS — M722 Plantar fascial fibromatosis: Secondary | ICD-10-CM

## 2017-05-21 MED ORDER — BETAMETHASONE SOD PHOS & ACET 6 (3-3) MG/ML IJ SUSP: 3.0000 mg | Freq: Once | INTRAMUSCULAR | Status: DC

## 2017-05-23 NOTE — Progress Notes (Signed)
   Subjective: Patient presents today for follow-up treatment and evaluation of plantar fasciitis to the right foot. He states he is doing well. He states he is taking prednisone which has helped with the pain of the right foot. He states he wants to possibly have surgery for the plantar fasciitis at the beginning of next year.   Past Medical History:  Diagnosis Date  . HA (headache)   . Hypertension   . Insomnia   . Sinusitis      Objective: Physical Exam General: The patient is alert and oriented x3 in no acute distress.  Dermatology: Skin is warm, dry and supple bilateral lower extremities. Negative for open lesions or macerations bilateral.   Vascular: Dorsalis Pedis and Posterior Tibial pulses palpable bilateral.  Capillary fill time is immediate to all digits.  Neurological: Epicritic and protective threshold intact bilateral.   Musculoskeletal: Tenderness to palpation at the medial calcaneal tubercale and through the insertion of the plantar fascia of the right foot. All other joints range of motion within normal limits bilateral. Strength 5/5 in all groups bilateral.   Assessment: 1. Plantar fasciitis right 2. Pain in right foot   Plan of Care:  1. Patient evaluated. 2. Injection of 0.5cc Celestone soluspan injected into the right heel at the insertion of the plantar fascia.  3. Continue custom molded insoles and good supportive shoe gear. 4. Patient planning on surgery at the beginning of next year. Surgical consult at next visit. 5. Return to clinic in 4 weeks.  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 Evans, DPM [facility]  Dr. Felecia ShellingBrent M. Evans, DPM    [address]                                        [place], [place] [address]                Office [phone]  Fax [phone](336) [phone]

## 2017-06-11 DIAGNOSIS — J342 Deviated nasal septum: Secondary | ICD-10-CM | POA: Insufficient documentation

## 2017-06-11 DIAGNOSIS — J343 Hypertrophy of nasal turbinates: Secondary | ICD-10-CM | POA: Insufficient documentation

## 2017-06-11 DIAGNOSIS — J3489 Other specified disorders of nose and nasal sinuses: Secondary | ICD-10-CM | POA: Insufficient documentation

## 2017-06-11 DIAGNOSIS — J324 Chronic pansinusitis: Secondary | ICD-10-CM | POA: Insufficient documentation

## 2017-06-20 ENCOUNTER — Encounter: Payer: Federal, State, Local not specified - PPO | Admitting: Podiatry

## 2017-06-27 NOTE — Progress Notes (Signed)
This encounter was created in error - please disregard.

## 2017-07-18 ENCOUNTER — Ambulatory Visit: Payer: Federal, State, Local not specified - PPO | Admitting: Podiatry

## 2017-07-18 DIAGNOSIS — M722 Plantar fascial fibromatosis: Secondary | ICD-10-CM

## 2017-07-22 NOTE — Progress Notes (Signed)
   Subjective: Patient presents today for follow-up treatment and evaluation of plantar fasciitis to the right foot.  He states the injection he received at the previous visit helped relieve the pain for approximately 4 weeks, but he has now returned.  He currently rates the pain at 6/10. Patient is here for further evaluation and treatment.   Past Medical History:  Diagnosis Date  . HA (headache)   . Hypertension   . Insomnia   . Sinusitis      Objective: Physical Exam General: The patient is alert and oriented x3 in no acute distress.  Dermatology: Skin is warm, dry and supple bilateral lower extremities. Negative for open lesions or macerations bilateral.   Vascular: Dorsalis Pedis and Posterior Tibial pulses palpable bilateral.  Capillary fill time is immediate to all digits.  Neurological: Epicritic and protective threshold intact bilateral.   Musculoskeletal: Tenderness to palpation at the medial calcaneal tubercale and through the insertion of the plantar fascia of the right foot. All other joints range of motion within normal limits bilateral. Strength 5/5 in all groups bilateral.   Assessment: 1. Plantar fasciitis right 2. Pain in right foot   Plan of Care:  1. Patient evaluated. 2. Injection of 0.5cc Celestone soluspan injected into the right heel at the insertion of the plantar fascia.  3. Continue wearing custom molded insoles and good supportive shoe gear. 4.  Patient is considering having plantar fasciotomy surgery in the late spring. 5.  Authorization for surgery at next visit. 6.  Return to clinic in 6 weeks.  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   Felecia ShellingBrent M. Evans, DPM [facility]  Dr. Felecia ShellingBrent M. Evans, DPM    [address]                                        [place], [place] [address]                Office [phone](336) [phone]  Fax [phone](336) [phone]

## 2017-08-22 ENCOUNTER — Encounter: Payer: Federal, State, Local not specified - PPO | Admitting: Podiatry

## 2017-08-28 NOTE — Progress Notes (Signed)
This encounter was created in error - please disregard.

## 2017-08-29 ENCOUNTER — Ambulatory Visit: Payer: Federal, State, Local not specified - PPO | Admitting: Podiatry

## 2017-09-03 ENCOUNTER — Ambulatory Visit: Payer: Federal, State, Local not specified - PPO | Admitting: Podiatry

## 2017-09-03 ENCOUNTER — Encounter: Payer: Self-pay | Admitting: Podiatry

## 2017-09-03 DIAGNOSIS — M722 Plantar fascial fibromatosis: Secondary | ICD-10-CM | POA: Diagnosis not present

## 2017-09-06 NOTE — Progress Notes (Signed)
   Subjective: Patient presents today for follow-up evaluation of plantar fasciitis to the right foot. She states the pain has returned and is as bad as it was when it first started. She has been stretching the area and elevating the foot with no significant relief. She states the injection helped alleviate the pain for about two weeks. Patient is here for further evaluation and treatment.   Past Medical History:  Diagnosis Date  . HA (headache)   . Hypertension   . Insomnia   . Sinusitis      Objective: Physical Exam General: The patient is alert and oriented x3 in no acute distress.  Dermatology: Skin is warm, dry and supple bilateral lower extremities. Negative for open lesions or macerations bilateral.   Vascular: Dorsalis Pedis and Posterior Tibial pulses palpable bilateral.  Capillary fill time is immediate to all digits.  Neurological: Epicritic and protective threshold intact bilateral.   Musculoskeletal: Tenderness to palpation at the medial calcaneal tubercale and through the insertion of the plantar fascia of the right foot. All other joints range of motion within normal limits bilateral. Strength 5/5 in all groups bilateral.   Assessment: 1. Plantar fasciitis right 2. Pain in right foot   Plan of Care:  1. Patient evaluated. 2. Injection of 0.5cc Celestone soluspan injected into the right heel at the insertion of the plantar fascia.  3. Continue wearing custom molded insoles and good supportive shoe gear. 4. Patient is considering having plantar fasciotomy surgery in the late spring. 5. Authorization for surgery at next visit. 6. Return to clinic in 6 weeks.  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   Felecia ShellingBrent M. Lauri Purdum, DPM [facility]  Dr. Felecia ShellingBrent M. Jahni Nazar, DPM    [address]                                        [place], [place] [address]                Office [phone]  Fax [phone]

## 2017-11-22 ENCOUNTER — Ambulatory Visit: Payer: Federal, State, Local not specified - PPO | Admitting: Adult Health

## 2017-12-25 NOTE — Progress Notes (Deleted)
Subjective:    Patient ID: CURTEZ BRALLIER, male    DOB: Mar 13, 1961, 57 y.o.   MRN: [id number]  HPI :  Mr. Pitcock is here to establish as a new pt.  He is pleasant 57 year old male.  PMH:HTN, OSA, HA, insomnia, and obesity  Patient Care Team    Relationship Specialty Notifications Start End  Julaine Fusi, NP PCP - General Family Medicine  10/10/17     Patient Active Problem List   Diagnosis Date Noted  . Obesity 09/29/2014  . Primary snoring 09/29/2014  . Cerebral infarction due to unspecified mechanism 09/29/2014  . Chronic daily headache 06/19/2014  . Analgesic rebound headache 06/19/2014  . Sleep apnea 06/19/2014     Past Medical History:  Diagnosis Date  . HA (headache)   . Hypertension   . Insomnia   . Sinusitis      Past Surgical History:  Procedure Laterality Date  . CHOLECYSTECTOMY, LAPAROSCOPIC       Family History  Problem Relation Age of Onset  . Diabetes Mother   . Heart disease Mother   . Pancreatic cancer Father   . Diabetes Father   . Hypertension Father   . Heart disease Father   . Crohn's disease Sister   . COPD Unknown        NEPHEW  . Prostate cancer Maternal Grandfather      Social History   Substance and Sexual Activity  Drug Use No     Social History   Substance and Sexual Activity  Alcohol Use No  . Alcohol/week: 0.0 oz     Social History   Tobacco Use  Smoking Status Former Smoker  . Last attempt to quit: 06/12/2014  . Years since quitting: 3.5  Smokeless Tobacco Never Used     Outpatient Encounter Medications as of 12/26/2017  Medication Sig Note  . atorvastatin (LIPITOR) 20 MG tablet Take 20 mg by mouth daily.   . cetirizine (ZYRTEC) 10 MG tablet Take 10 mg by mouth daily.   . divalproex (DEPAKOTE ER) 500 MG 24 hr tablet TAKE 1 TABLET BY MOUTH EVERY DAY   . famotidine (PEPCID) 20 MG tablet Take 20 mg by mouth. 06/19/2014: Received from: [facility]  . losartan (COZAAR) 50 MG tablet Take 50 mg by mouth.  06/19/2014: Received from: [facility]  . Multiple Vitamin (MULTI-VITAMINS) TABS Take by mouth. 06/19/2014: Received from: [facility]  . NONFORMULARY OR COMPOUNDED ITEM [facility]: Achilles tendonitis cream - Diclofenac #, Baclofen 2%, Bupivacaine 1%, Gabapentin 6%, Ibuprofen 3%, Pentoxifylline 3%, apply 1-2 grams to affected area 3-4 times daily.   . Omega-3 1000 MG CAPS Take by mouth. 06/19/2014: Received from: [facility]  . omeprazole (PRILOSEC) 20 MG capsule Take 20 mg by mouth daily.   . predniSONE (DELTASONE) 10 MG tablet Take 2 tablets in the morning for 5 days then 1 tablet in the morning for 5 days   . sertraline (ZOLOFT) 100 MG tablet TAKE 1 TABLET BY MOUTH EVERY DAY   . traMADol (ULTRAM) 50 MG tablet TAKE 2 TABLETS EVERY 6 HOURS AS NEEDED    Facility-Administered Encounter Medications as of 12/26/2017  Medication  . betamethasone acetate-betamethasone sodium phosphate (CELESTONE) injection 3 mg  . betamethasone acetate-betamethasone sodium phosphate (CELESTONE) injection 3 mg    Allergies: Penicillins  There is no height or weight on file to calculate BMI.  There were no vitals taken for this visit.     Review  of Systems     Objective:   Physical Exam        Assessment & Plan:  No diagnosis found.  No problem-specific Assessment & Plan notes found for this encounter.    FOLLOW-UP:  No follow-ups on file.

## 2017-12-26 ENCOUNTER — Ambulatory Visit: Payer: Federal, State, Local not specified - PPO | Admitting: Adult Health

## 2018-02-28 DIAGNOSIS — G2581 Restless legs syndrome: Secondary | ICD-10-CM | POA: Insufficient documentation

## 2018-10-10 DIAGNOSIS — J209 Acute bronchitis, unspecified: Secondary | ICD-10-CM | POA: Diagnosis not present

## 2018-10-10 DIAGNOSIS — R351 Nocturia: Secondary | ICD-10-CM | POA: Diagnosis not present

## 2018-10-10 DIAGNOSIS — Z87891 Personal history of nicotine dependence: Secondary | ICD-10-CM | POA: Diagnosis not present

## 2018-10-10 DIAGNOSIS — F329 Major depressive disorder, single episode, unspecified: Secondary | ICD-10-CM | POA: Diagnosis not present

## 2018-10-10 DIAGNOSIS — I10 Essential (primary) hypertension: Secondary | ICD-10-CM | POA: Diagnosis not present

## 2018-10-15 DIAGNOSIS — J101 Influenza due to other identified influenza virus with other respiratory manifestations: Secondary | ICD-10-CM | POA: Diagnosis not present

## 2018-10-15 DIAGNOSIS — R52 Pain, unspecified: Secondary | ICD-10-CM | POA: Diagnosis not present

## 2018-10-15 DIAGNOSIS — R509 Fever, unspecified: Secondary | ICD-10-CM | POA: Diagnosis not present

## 2018-10-15 DIAGNOSIS — R05 Cough: Secondary | ICD-10-CM | POA: Diagnosis not present

## 2018-12-19 ENCOUNTER — Ambulatory Visit: Payer: Federal, State, Local not specified - PPO | Admitting: Podiatry

## 2018-12-19 ENCOUNTER — Other Ambulatory Visit: Payer: Self-pay

## 2018-12-19 ENCOUNTER — Ambulatory Visit (INDEPENDENT_AMBULATORY_CARE_PROVIDER_SITE_OTHER): Payer: Federal, State, Local not specified - PPO

## 2018-12-19 ENCOUNTER — Other Ambulatory Visit: Payer: Self-pay | Admitting: Podiatry

## 2018-12-19 ENCOUNTER — Encounter: Payer: Self-pay | Admitting: Podiatry

## 2018-12-19 VITALS — Temp 97.5°F

## 2018-12-19 DIAGNOSIS — M722 Plantar fascial fibromatosis: Secondary | ICD-10-CM

## 2018-12-19 DIAGNOSIS — M79671 Pain in right foot: Secondary | ICD-10-CM

## 2018-12-19 MED ORDER — TRIAMCINOLONE ACETONIDE 10 MG/ML IJ SUSP
10.0000 mg | Freq: Once | INTRAMUSCULAR | Status: AC
  Administered 2018-12-19: 10 mg

## 2018-12-19 NOTE — Patient Instructions (Signed)

## 2018-12-19 NOTE — Progress Notes (Signed)
Subjective:   Patient ID: [name], male   DOB: 58 y.o.   MRN: [id number]   HPI Patient presents with acute plantar fascial symptomatology right stating it is been very sore over the last few months and has had on and off problems over the last year and a half since he was seen last.  States is worse when he gets up in the morning and after periods of sitting   ROS      Objective:  Physical Exam  Acute plantar fascial symptomatology right with inflammation fluid around the medial band with high arch foot structure     Assessment:  Acute plantar fasciitis right with pain upon palpation     Plan:  H&P x-ray reviewed and today I did sterile prep injected the fascia 3 mg Kenalog 5 mg Xylocaine and applied fascial brace right and gave instructions for physical therapy and also dispensed night splint with instructions on usage  X-ray indicates there is spur but no indications of stress fracture arthritis

## 2019-01-09 ENCOUNTER — Ambulatory Visit: Payer: Federal, State, Local not specified - PPO | Admitting: Podiatry

## 2019-01-23 ENCOUNTER — Ambulatory Visit: Payer: Federal, State, Local not specified - PPO | Admitting: Podiatry

## 2019-02-20 DIAGNOSIS — M5431 Sciatica, right side: Secondary | ICD-10-CM | POA: Diagnosis not present

## 2019-02-26 DIAGNOSIS — I10 Essential (primary) hypertension: Secondary | ICD-10-CM | POA: Diagnosis not present

## 2019-02-26 DIAGNOSIS — M5416 Radiculopathy, lumbar region: Secondary | ICD-10-CM | POA: Diagnosis not present

## 2019-02-26 DIAGNOSIS — Z6835 Body mass index (BMI) 35.0-35.9, adult: Secondary | ICD-10-CM | POA: Diagnosis not present

## 2019-03-04 DIAGNOSIS — M545 Low back pain: Secondary | ICD-10-CM | POA: Diagnosis not present

## 2019-03-04 DIAGNOSIS — M5416 Radiculopathy, lumbar region: Secondary | ICD-10-CM | POA: Diagnosis not present

## 2019-03-06 DIAGNOSIS — M5126 Other intervertebral disc displacement, lumbar region: Secondary | ICD-10-CM | POA: Diagnosis not present

## 2019-03-06 DIAGNOSIS — M5416 Radiculopathy, lumbar region: Secondary | ICD-10-CM | POA: Diagnosis not present

## 2019-03-13 DIAGNOSIS — M5416 Radiculopathy, lumbar region: Secondary | ICD-10-CM | POA: Diagnosis not present

## 2019-03-13 DIAGNOSIS — M5117 Intervertebral disc disorders with radiculopathy, lumbosacral region: Secondary | ICD-10-CM | POA: Diagnosis not present

## 2019-04-10 DIAGNOSIS — I10 Essential (primary) hypertension: Secondary | ICD-10-CM | POA: Diagnosis not present

## 2019-04-10 DIAGNOSIS — Z6834 Body mass index (BMI) 34.0-34.9, adult: Secondary | ICD-10-CM | POA: Diagnosis not present

## 2019-04-10 DIAGNOSIS — E669 Obesity, unspecified: Secondary | ICD-10-CM | POA: Diagnosis not present

## 2019-04-10 DIAGNOSIS — Z8249 Family history of ischemic heart disease and other diseases of the circulatory system: Secondary | ICD-10-CM | POA: Diagnosis not present

## 2019-04-10 DIAGNOSIS — Z87891 Personal history of nicotine dependence: Secondary | ICD-10-CM | POA: Diagnosis not present

## 2019-04-10 DIAGNOSIS — F329 Major depressive disorder, single episode, unspecified: Secondary | ICD-10-CM | POA: Diagnosis not present

## 2019-04-10 DIAGNOSIS — E782 Mixed hyperlipidemia: Secondary | ICD-10-CM | POA: Diagnosis not present

## 2019-04-24 ENCOUNTER — Ambulatory Visit: Payer: Federal, State, Local not specified - PPO | Admitting: Podiatry

## 2019-04-29 ENCOUNTER — Encounter: Payer: Self-pay | Admitting: Podiatry

## 2019-04-29 ENCOUNTER — Other Ambulatory Visit: Payer: Self-pay

## 2019-04-29 ENCOUNTER — Ambulatory Visit (INDEPENDENT_AMBULATORY_CARE_PROVIDER_SITE_OTHER): Payer: Federal, State, Local not specified - PPO | Admitting: Podiatry

## 2019-04-29 DIAGNOSIS — M722 Plantar fascial fibromatosis: Secondary | ICD-10-CM | POA: Diagnosis not present

## 2019-04-29 NOTE — Progress Notes (Signed)
Subjective:   Patient ID: Bryan Ochoa, male   DOB: 58 y.o.   MRN: [id number]   HPI Patient states his heels have just recently flared up again and are very tender   ROS      Objective:  Physical Exam  Acute plantar fasciitis bilateral with fluid buildup when I pressed the plantar heel     Assessment:  Plantar fasciitis bilateral heel right over left     Plan:  H&P reviewed condition sterile prep done and injected the plantar fascia bilateral 3 mg Kenalog 5 mg Xylocaine and instructed on physical therapy anti-inflammatories

## 2019-06-29 ENCOUNTER — Other Ambulatory Visit: Payer: Self-pay

## 2019-06-29 ENCOUNTER — Ambulatory Visit
Admission: EM | Admit: 2019-06-29 | Discharge: 2019-06-29 | Disposition: A | Discharge status: Home / Self Care | Payer: Federal, State, Local not specified - PPO | Attending: Emergency Medicine | Admitting: Emergency Medicine

## 2019-06-29 ENCOUNTER — Encounter: Payer: Self-pay | Admitting: Emergency Medicine

## 2019-06-29 DIAGNOSIS — Z20828 Contact with and (suspected) exposure to other viral communicable diseases: Secondary | ICD-10-CM | POA: Diagnosis not present

## 2019-06-29 DIAGNOSIS — R519 Headache, unspecified: Secondary | ICD-10-CM

## 2019-06-29 DIAGNOSIS — R05 Cough: Secondary | ICD-10-CM | POA: Diagnosis not present

## 2019-06-29 DIAGNOSIS — Z20822 Contact with and (suspected) exposure to covid-19: Secondary | ICD-10-CM

## 2019-06-29 NOTE — ED Notes (Signed)
Patient able to ambulate independently  

## 2019-06-29 NOTE — ED Provider Notes (Signed)
[facility]    CSN: [id number] Arrival date & time: 06/29/19  1417      History   Chief Complaint Chief Complaint  Patient presents with  . COVID Exposure  . Cough    HPI Bryan Ochoa is a 58 y.o. male with history of headaches, hypertension  Presenting for Covid testing: Exposure: work client Date of exposure: Monday Any fever, symptoms since exposure: Yes Cough, nasal congestion, headache, chills, scratchy throat.  Denies fever, nausea, vomiting, diarrhea, change in smell/taste.  Has been taking Sudafed, Tylenol as needed with moderate symptom relief.  Past Medical History:  Diagnosis Date  . HA (headache)   . Hypertension   . Insomnia   . Sinusitis     Patient Active Problem List   Diagnosis Date Noted  . Serum calcium elevated 04/10/2019  . Restless legs 02/28/2018  . Chronic pansinusitis 06/11/2017  . Deviated nasal septum 06/11/2017  . Hypertrophy of inferior nasal turbinate 06/11/2017  . Nasal obstruction 06/11/2017  . Nocturia 04/24/2017  . Recurrent sinusitis 09/05/2016  . Depression 10/26/2015  . GERD (gastroesophageal reflux disease) 10/26/2015  . Mixed hyperlipidemia 10/26/2015  . Obesity 09/29/2014  . Primary snoring 09/29/2014  . Cerebral infarction due to unspecified mechanism 09/29/2014  . Chronic daily headache 06/19/2014  . Analgesic rebound headache 06/19/2014  . Sleep apnea 06/19/2014  . Drug-induced headache, not elsewhere classified, not intractable 06/19/2014  . Essential (primary) hypertension 05/12/2014  . Low back pain 05/12/2014    Past Surgical History:  Procedure Laterality Date  . CHOLECYSTECTOMY, LAPAROSCOPIC         Home Medications    Prior to Admission medications   Medication Sig Start Date End Date Taking? Authorizing Provider  aspirin EC 81 MG tablet Take by mouth.    [provider]  atorvastatin (LIPITOR) 20 MG tablet Take 20 mg by mouth daily.    [provider]   cetirizine (ZYRTEC) 10 MG tablet Take 10 mg by mouth daily.    [provider]  divalproex (DEPAKOTE ER) 500 MG 24 hr tablet TAKE 1 TABLET BY MOUTH EVERY DAY 11/22/15   Micki RileySethi, Pramod S, MD  famotidine (PEPCID) 20 MG tablet Take 20 mg by mouth.    [provider]  losartan (COZAAR) 100 MG tablet  04/10/19   [provider]  Multiple Vitamin (MULTI-VITAMINS) TABS Take by mouth.    [provider]  NONFORMULARY OR COMPOUNDED ITEM [facility]: Achilles tendonitis cream - Diclofenac #, Baclofen 2%, Bupivacaine 1%, Gabapentin 6%, Ibuprofen 3%, Pentoxifylline 3%, apply 1-2 grams to affected area 3-4 times daily. 09/13/16   Felecia ShellingEvans, Brent M, DPM  Omega-3 1000 MG CAPS Take by mouth.    [provider]  omeprazole (PRILOSEC) 20 MG capsule Take 20 mg by mouth daily.    [provider]  sertraline (ZOLOFT) 100 MG tablet TAKE 1 TABLET BY MOUTH EVERY DAY 07/21/15   Micki RileySethi, Pramod S, MD  tamsulosin [employer](FLOMAX) 0.4 MG CAPS capsule  04/17/19   [provider]  traMADol (ULTRAM) 50 MG tablet TAKE 2 TABLETS EVERY 6 HOURS AS NEEDED 05/18/15   Micki RileySethi, Pramod S, MD    Family History Family History  Problem Relation Age of Onset  . Diabetes Mother   . Heart disease Mother   . Pancreatic cancer Father   . Diabetes Father   . Hypertension Father   . Heart disease Father   . Crohn's disease Sister   . COPD Other  NEPHEW  . Prostate cancer Maternal Grandfather     Social History Social History   Tobacco Use  . Smoking status: Former Smoker    Quit date: 06/12/2014    Years since quitting: 5.0  . Smokeless tobacco: Never Used  Substance Use Topics  . Alcohol use: No    Alcohol/week: 0.0 standard drinks  . Drug use: No     Allergies   Penicillins   Review of Systems Review of Systems  Constitutional: Positive for chills. Negative for fatigue and fever.  HENT: Positive for sore throat. Negative for congestion, dental problem, ear pain,  facial swelling, hearing loss, sinus pain, trouble swallowing and voice change.   Eyes: Negative for photophobia, pain and visual disturbance.  Respiratory: Positive for cough. Negative for shortness of breath and wheezing.   Cardiovascular: Negative for chest pain and palpitations.  Gastrointestinal: Negative for diarrhea and vomiting.  Musculoskeletal: Negative for arthralgias and myalgias.  Neurological: Positive for headaches. Negative for dizziness, speech difficulty and numbness.     Physical Exam Triage Vital Signs ED Triage Vitals  Enc Vitals Group     BP      Pulse      Resp      Temp      Temp src      SpO2      Weight      Height      Head Circumference      Peak Flow      Pain Score      Pain Loc      Pain Edu?      Excl. in GC?    No data found.  Updated Vital Signs BP (!) 154/87 (BP Location: Left Arm)   Pulse 84   Temp (!) 97.1 F (36.2 C) (Temporal)   Resp 18   SpO2 92%   Visual Acuity Right Eye Distance:   Left Eye Distance:   Bilateral Distance:    Right Eye Near:   Left Eye Near:    Bilateral Near:     Physical Exam Constitutional:      General: He is not in acute distress.    Appearance: He is not toxic-appearing.  HENT:     Head: Normocephalic and atraumatic.     Mouth/Throat:     Mouth: Mucous membranes are moist.     Pharynx: Oropharynx is clear.  Eyes:     General: No scleral icterus.    Conjunctiva/sclera: Conjunctivae normal.     Pupils: Pupils are equal, round, and reactive to light.  Cardiovascular:     Rate and Rhythm: Normal rate and regular rhythm.     Heart sounds: No murmur. No gallop.   Pulmonary:     Effort: Pulmonary effort is normal. No respiratory distress.     Breath sounds: No stridor. No wheezing, rhonchi or rales.  Musculoskeletal: Normal range of motion.     Right lower leg: No edema.     Left lower leg: No edema.  Skin:    General: Skin is warm.     Capillary Refill: Capillary refill takes less than 2  seconds.     Coloration: Skin is not jaundiced or pale.     Findings: No rash.  Neurological:     General: No focal deficit present.     Mental Status: He is alert and oriented to person, place, and time.     Coordination: Coordination normal.     Gait: Gait normal.  UC Treatments / Results  Labs (all labs ordered are listed, but only abnormal results are displayed) Labs Reviewed  NOVEL CORONAVIRUS, NAA    EKG   Radiology No results found.  Procedures Procedures (including critical care time)  Medications Ordered in UC Medications - No data to display  Initial Impression / Assessment and Plan / UC Course  I have reviewed the triage vital signs and the nursing notes.  Pertinent labs & imaging results that were available during my care of the patient were reviewed by me and considered in my medical decision making (see chart for details).     Patient afebrile, nontoxic in office.  PCR Covid test pending: Patient to quarantine until results are back.  We will continue supportive care at home.  Return precautions discussed, patient verbalized understanding and is agreeable to plan. Final Clinical Impressions(s) / UC Diagnoses   Final diagnoses:  Exposure to COVID-19 virus     Discharge Instructions     Your COVID test is pending - it is important to quarantine / isolate at home until your results are back. If you test positive and would like further evaluation for persistent or worsening symptoms, you may schedule an E-visit or virtual (video) visit throughout the [facility] app or website.  PLEASE NOTE: If you develop severe chest pain or shortness of breath please go to the ER or call 9-1-1 for further evaluation --> DO NOT schedule electronic or virtual visits for this. Please call our office for further guidance / recommendations as needed.    ED Prescriptions    None     PDMP not reviewed this encounter.   Hall-Potvin, [place], [place]  06/29/19 1451

## 2019-06-29 NOTE — Discharge Instructions (Addendum)
Your COVID test is pending - it is important to quarantine / isolate at home until your results are back. °If you test positive and would like further evaluation for persistent or worsening symptoms, you may schedule an E-visit or virtual (video) visit throughout the [facility] MyChart app or website. ° °PLEASE NOTE: If you develop severe chest pain or shortness of breath please go to the ER or call 9-1-1 for further evaluation --> DO NOT schedule electronic or virtual visits for this. °Please call our office for further guidance / recommendations as needed. °

## 2019-06-29 NOTE — ED Triage Notes (Signed)
Pt presents to [facility] for assessment of cough, nasal congestion, headache, chills, scratchy throat.  Denies n/v/d.  Exposed to a COVID positive client at work Monday.

## 2019-06-30 LAB — NOVEL CORONAVIRUS, NAA: SARS-CoV-2, NAA: DETECTED — AB

## 2019-07-02 ENCOUNTER — Telehealth (HOSPITAL_COMMUNITY): Payer: Self-pay | Admitting: Emergency Medicine

## 2019-07-02 NOTE — Telephone Encounter (Signed)
Patient contacted and made aware of    results. Pt verbalized understanding and had all questions answered. Quarantine ends on Dec 2nd. MyChart info sent.

## 2019-07-02 NOTE — Telephone Encounter (Signed)
Positive covid on sample. Attempted to reach patient, wife answered, and stated he would call back within the hour.

## 2019-09-21 ENCOUNTER — Other Ambulatory Visit: Payer: Self-pay

## 2019-09-21 ENCOUNTER — Ambulatory Visit
Admission: EM | Admit: 2019-09-21 | Discharge: 2019-09-21 | Disposition: A | Discharge status: Home / Self Care | Payer: Federal, State, Local not specified - PPO | Attending: Emergency Medicine | Admitting: Emergency Medicine

## 2019-09-21 DIAGNOSIS — M5441 Lumbago with sciatica, right side: Secondary | ICD-10-CM

## 2019-09-21 MED ORDER — NAPROXEN 500 MG PO TABS: 500.0000 mg | ORAL_TABLET | Freq: Two times a day (BID) | ORAL | 0 refills | Status: DC

## 2019-09-21 MED ORDER — METHYLPREDNISOLONE SODIUM SUCC 125 MG IJ SOLR
125.0000 mg | Freq: Once | INTRAMUSCULAR | Status: AC
  Administered 2019-09-21: 125 mg via INTRAMUSCULAR

## 2019-09-21 NOTE — ED Provider Notes (Signed)
[facility]    CSN: [id number] Arrival date & time: 09/21/19  1439      History   Chief Complaint Chief Complaint  Patient presents with  . Hip Pain    right    HPI DAVARI LOPES is a 59 y.o. male with history of hypertension, chronic low back pain presenting for right-sided sciatica.  States has had these flares before since having back surgery last year: Last flare per chart review was in July 2020.  States he has been doing stretching, heating pads without significant improvement.  Has not tried ibuprofen as this "never helps ".  States he usually receives a steroid shot that resolved symptoms.  States this feels consistent with previous sciatic flares: No saddle area anesthesia, urinary or bowel incontinence, lower extremity numbness/weakness, fever.  Denies trauma or injury to the area.   Past Medical History:  Diagnosis Date  . HA (headache)   . Hypertension   . Insomnia   . Sinusitis     Patient Active Problem List   Diagnosis Date Noted  . Serum calcium elevated 04/10/2019  . Restless legs 02/28/2018  . Chronic pansinusitis 06/11/2017  . Deviated nasal septum 06/11/2017  . Hypertrophy of inferior nasal turbinate 06/11/2017  . Nasal obstruction 06/11/2017  . Nocturia 04/24/2017  . Recurrent sinusitis 09/05/2016  . Depression 10/26/2015  . GERD (gastroesophageal reflux disease) 10/26/2015  . Mixed hyperlipidemia 10/26/2015  . Obesity 09/29/2014  . Primary snoring 09/29/2014  . Cerebral infarction due to unspecified mechanism 09/29/2014  . Chronic daily headache 06/19/2014  . Analgesic rebound headache 06/19/2014  . Sleep apnea 06/19/2014  . Drug-induced headache, not elsewhere classified, not intractable 06/19/2014  . Essential (primary) hypertension 05/12/2014  . Low back pain 05/12/2014    Past Surgical History:  Procedure Laterality Date  . BACK SURGERY    . CHOLECYSTECTOMY, LAPAROSCOPIC         Home Medications    Prior to  Admission medications   Medication Sig Start Date End Date Taking? Authorizing Provider  aspirin EC 81 MG tablet Take by mouth.    [provider]  atorvastatin (LIPITOR) 20 MG tablet Take 20 mg by mouth daily.    [provider]  cetirizine (ZYRTEC) 10 MG tablet Take 10 mg by mouth daily.    [provider]  divalproex (DEPAKOTE ER) 500 MG 24 hr tablet TAKE 1 TABLET BY MOUTH EVERY DAY 11/22/15   Micki Riley, MD  famotidine (PEPCID) 20 MG tablet Take 20 mg by mouth.    [provider]  losartan (COZAAR) 100 MG tablet  04/10/19   [provider]  Multiple Vitamin (MULTI-VITAMINS) TABS Take by mouth.    [provider]  naproxen (NAPROSYN) 500 MG tablet Take 1 tablet (500 mg total) by mouth 2 (two) times daily. 09/21/19   Hall-Potvin, [place], PA-C  NONFORMULARY OR COMPOUNDED ITEM [facility]: Achilles tendonitis cream - Diclofenac #, Baclofen 2%, Bupivacaine 1%, Gabapentin 6%, Ibuprofen 3%, Pentoxifylline 3%, apply 1-2 grams to affected area 3-4 times daily. 09/13/16   Felecia Shelling, DPM  sertraline (ZOLOFT) 100 MG tablet TAKE 1 TABLET BY MOUTH EVERY DAY 07/21/15   Micki Riley, MD  omeprazole (PRILOSEC) 20 MG capsule Take 20 mg by mouth daily.  09/21/19  [provider]    Family History Family History  Problem Relation Age of Onset  . Diabetes Mother   . Heart disease Mother   . Pancreatic cancer Father   .  Diabetes Father   . Hypertension Father   . Heart disease Father   . Crohn's disease Sister   . COPD Other        NEPHEW  . Prostate cancer Maternal Grandfather     Social History Social History   Tobacco Use  . Smoking status: Former Smoker    Quit date: 06/12/2014    Years since quitting: 5.2  . Smokeless tobacco: Never Used  Substance Use Topics  . Alcohol use: No    Alcohol/week: 0.0 standard drinks  . Drug use: No     Allergies   Penicillins   Review of Systems As per HPI   Physical  Exam Triage Vital Signs ED Triage Vitals  Enc Vitals Group     BP      Pulse      Resp      Temp      Temp src      SpO2      Weight      Height      Head Circumference      Peak Flow      Pain Score      Pain Loc      Pain Edu?      Excl. in GC?    No data found.  Updated Vital Signs BP (!) 133/91 (BP Location: Left Arm)   Pulse 74   Temp 98.4 F (36.9 C)   Resp 15   SpO2 96%   Visual Acuity Right Eye Distance:   Left Eye Distance:   Bilateral Distance:    Right Eye Near:   Left Eye Near:    Bilateral Near:     Physical Exam Constitutional:      General: He is not in acute distress. HENT:     Head: Normocephalic and atraumatic.  Eyes:     General: No scleral icterus.    Pupils: Pupils are equal, round, and reactive to light.  Cardiovascular:     Rate and Rhythm: Normal rate.  Pulmonary:     Effort: Pulmonary effort is normal. No respiratory distress.     Breath sounds: No wheezing.  Musculoskeletal:     Comments: Mild swelling in right low back without ecchymosis, erythema, fluctuance, induration, crepitus or mass.  Patient has decreased ROM of right hip secondary pain.  Overall exam limited.  Skin:    Capillary Refill: Capillary refill takes less than 2 seconds.     Coloration: Skin is not jaundiced or pale.     Findings: No bruising.  Neurological:     Mental Status: He is alert and oriented to person, place, and time.     Sensory: No sensory deficit.     Gait: Gait normal.     Deep Tendon Reflexes: Reflexes normal.      UC Treatments / Results  Labs (all labs ordered are listed, but only abnormal results are displayed) Labs Reviewed - No data to display  EKG   Radiology No results found.  Procedures Procedures (including critical care time)  Medications Ordered in UC Medications  methylPREDNISolone sodium succinate (SOLU-MEDROL) 125 mg/2 mL injection 125 mg (125 mg Intramuscular Given 09/21/19 1551)    Initial Impression /  Assessment and Plan / UC Course  I have reviewed the triage vital signs and the nursing notes.  Pertinent labs & imaging results that were available during my care of the patient were reviewed by me and considered in my medical decision making (see chart for details).  Patient afebrile, nontoxic in office today.  H&P consistent with right-sided sciatica.  Patient given IM Solu-Medrol in office which he tolerated well.  Will provide naproxen to use starting tomorrow should symptoms persist; at which point he will follow-up with PCP.  Return precautions discussed, patient verbalized understanding and is agreeable to plan. Final Clinical Impressions(s) / UC Diagnoses   Final diagnoses:  Acute right-sided back pain with sciatica     Discharge Instructions     Recommend RICE: rest, ice, compression, elevation as needed for pain.    Heat therapy (hot compress, warm wash red, hot showers, etc.) can help relax muscles and soothe muscle aches. Cold therapy (ice packs) can be used to help swelling both after injury and after prolonged use of areas of chronic pain/aches.  For pain: take naproxen, may add tylenol  Return for worsening pain, numbness in genitals, inability to hold urine/stool, numbness in legs, fever.    ED Prescriptions    Medication Sig Dispense Auth. Provider   naproxen (NAPROSYN) 500 MG tablet Take 1 tablet (500 mg total) by mouth 2 (two) times daily. 30 tablet Hall-Potvin, [place], PA-C     PDMP not reviewed this encounter.   Hall-Potvin, [place], [place] 09/21/19 1634

## 2019-09-21 NOTE — Discharge Instructions (Addendum)
Recommend RICE: rest, ice, compression, elevation as needed for pain.    Heat therapy (hot compress, warm wash red, hot showers, etc.) can help relax muscles and soothe muscle aches. Cold therapy (ice packs) can be used to help swelling both after injury and after prolonged use of areas of chronic pain/aches.  For pain: take naproxen, may add tylenol  Return for worsening pain, numbness in genitals, inability to hold urine/stool, numbness in legs, fever.

## 2019-09-21 NOTE — ED Triage Notes (Addendum)
Patient presents with right sided hip pain.  He reports having a h/o sciatica and back surgery last year. Home intervention (stretching) with no significant improvement. Pain worse with walking.

## 2019-10-03 ENCOUNTER — Encounter: Payer: Self-pay | Admitting: Internal Medicine

## 2019-10-09 ENCOUNTER — Ambulatory Visit: Payer: Federal, State, Local not specified - PPO | Admitting: Internal Medicine

## 2019-11-20 ENCOUNTER — Ambulatory Visit
Admission: EM | Admit: 2019-11-20 | Discharge: 2019-11-20 | Disposition: A | Discharge status: Home / Self Care | Payer: Federal, State, Local not specified - PPO

## 2019-11-20 ENCOUNTER — Encounter: Payer: Self-pay | Admitting: Emergency Medicine

## 2019-11-20 ENCOUNTER — Other Ambulatory Visit: Payer: Self-pay

## 2019-11-20 DIAGNOSIS — Z760 Encounter for issue of repeat prescription: Secondary | ICD-10-CM

## 2019-11-20 MED ORDER — LOSARTAN POTASSIUM 100 MG PO TABS: 100.0000 mg | ORAL_TABLET | Freq: Every day | ORAL | 0 refills | Status: DC

## 2019-11-20 MED ORDER — ATORVASTATIN CALCIUM 20 MG PO TABS: 20.0000 mg | ORAL_TABLET | Freq: Every day | ORAL | 0 refills | Status: DC

## 2019-11-20 MED ORDER — OMEPRAZOLE 20 MG PO CPDR: 20.0000 mg | DELAYED_RELEASE_CAPSULE | Freq: Every day | ORAL | 0 refills | Status: DC

## 2019-11-20 MED ORDER — TAMSULOSIN HCL 0.4 MG PO CAPS: 0.4000 mg | ORAL_CAPSULE | Freq: Every day | ORAL | 0 refills | Status: DC

## 2019-11-20 MED ORDER — SERTRALINE HCL 100 MG PO TABS: 100.0000 mg | ORAL_TABLET | Freq: Every day | ORAL | 0 refills | Status: DC

## 2019-11-20 NOTE — ED Provider Notes (Signed)
[facility]    CSN: [id number] Arrival date & time: 11/20/19  1915      History   Chief Complaint Chief Complaint  Patient presents with  . Medication Refill    HPI Bryan Ochoa is a 59 y.o. male.   59 year old male comes in for medication refill. States currently in between PCPs, new PCP appointment 12/25/2019. Denies problems with medication. Denies recent changes in dosages.      Past Medical History:  Diagnosis Date  . Chronic daily headache   . Depression   . Deviated nasal septum   . Essential hypertension   . GERD (gastroesophageal reflux disease)   . Insomnia   . Mixed hyperlipidemia   . Nocturia   . Obesity   . RLS (restless legs syndrome)   . Sinusitis   . Sleep apnea     Patient Active Problem List   Diagnosis Date Noted  . Serum calcium elevated 04/10/2019  . Restless legs 02/28/2018  . Chronic pansinusitis 06/11/2017  . Deviated nasal septum 06/11/2017  . Hypertrophy of inferior nasal turbinate 06/11/2017  . Nasal obstruction 06/11/2017  . Nocturia 04/24/2017  . Recurrent sinusitis 09/05/2016  . Depression 10/26/2015  . GERD (gastroesophageal reflux disease) 10/26/2015  . Mixed hyperlipidemia 10/26/2015  . Obesity 09/29/2014  . Primary snoring 09/29/2014  . Cerebral infarction due to unspecified mechanism 09/29/2014  . Chronic daily headache 06/19/2014  . Analgesic rebound headache 06/19/2014  . Sleep apnea 06/19/2014  . Drug-induced headache, not elsewhere classified, not intractable 06/19/2014  . Essential (primary) hypertension 05/12/2014  . Low back pain 05/12/2014    Past Surgical History:  Procedure Laterality Date  . BACK SURGERY    . CHOLECYSTECTOMY, LAPAROSCOPIC    . NASAL SINUS SURGERY    . SEPTOPLASTY    . WISDOM TOOTH EXTRACTION         Home Medications    Prior to Admission medications   Medication Sig Start Date End Date Taking? Authorizing Provider  buPROPion (ZYBAN) 150 MG 12 hr tablet Take 150  mg by mouth 2 (two) times daily.   Yes [provider]  traMADol (ULTRAM) 50 MG tablet Take by mouth every 6 (six) hours as needed.   Yes [provider]  aspirin EC 81 MG tablet Take by mouth.    [provider]  atorvastatin (LIPITOR) 20 MG tablet Take 1 tablet (20 mg total) by mouth daily. 11/20/19   Tasia Catchings, Amy V, PA-C  cetirizine (ZYRTEC) 10 MG tablet Take 10 mg by mouth daily.    [provider]  losartan (COZAAR) 100 MG tablet Take 1 tablet (100 mg total) by mouth daily. 11/20/19   Tasia Catchings, Amy V, PA-C  Multiple Vitamin (MULTI-VITAMINS) TABS Take by mouth.    [provider]  NONFORMULARY OR COMPOUNDED [facility]: Achilles tendonitis cream - Diclofenac #, Baclofen 2%, Bupivacaine 1%, Gabapentin 6%, Ibuprofen 3%, Pentoxifylline 3%, apply 1-2 grams to affected area 3-4 times daily. 09/13/16   Edrick Kins, DPM  omeprazole (PRILOSEC) 20 MG capsule Take 1 capsule (20 mg total) by mouth daily. 11/20/19   Tasia Catchings, Amy V, PA-C  sertraline (ZOLOFT) 100 MG tablet Take 1 tablet (100 mg total) by mouth daily. 11/20/19   Tasia Catchings, Amy V, PA-C  tamsulosin (FLOMAX) 0.4 MG CAPS capsule Take 1 capsule (0.4 mg total) by mouth daily. 11/20/19   Tasia Catchings, Amy V, PA-C  divalproex (DEPAKOTE ER) 500 MG 24 hr tablet TAKE 1 TABLET BY MOUTH  EVERY DAY 11/22/15 11/20/19  Micki Riley, MD  famotidine (PEPCID) 20 MG tablet Take 20 mg by mouth.  11/20/19  [provider]    Family History Family History  Problem Relation Age of Onset  . Diabetes Mother   . Heart disease Mother   . Hypertension Mother   . Cancer Mother   . Pancreatic cancer Father   . Diabetes Father   . Hypertension Father   . Heart disease Father   . Lung disease Father   . Alcohol abuse Father   . Crohn's disease Sister   . COPD Other        NEPHEW  . Prostate cancer Maternal Grandfather     Social History Social History   Tobacco Use  . Smoking status: Former Smoker    Quit date: 06/12/2014     Years since quitting: 5.4  . Smokeless tobacco: Never Used  Substance Use Topics  . Alcohol use: No    Alcohol/week: 0.0 standard drinks  . Drug use: No     Allergies   Penicillins   Review of Systems Review of Systems  Reason unable to perform ROS: See HPI as above.     Physical Exam Triage Vital Signs ED Triage Vitals  Enc Vitals Group     BP 11/20/19 1954 (!) 137/105     Pulse Rate 11/20/19 1954 74     Resp 11/20/19 1954 20     Temp 11/20/19 1954 98.1 F (36.7 C)     Temp Source 11/20/19 1954 Oral     SpO2 11/20/19 1954 94 %     Weight --      Height --      Head Circumference --      Peak Flow --      Pain Score 11/20/19 1949 5     Pain Loc --      Pain Edu? --      Excl. in GC? --    No data found.  Updated Vital Signs BP (!) 137/105 (BP Location: Left Arm) Comment: large cuff  Pulse 74   Temp 98.1 F (36.7 C) (Oral)   Resp 20   SpO2 94%   Visual Acuity Right Eye Distance:   Left Eye Distance:   Bilateral Distance:    Right Eye Near:   Left Eye Near:    Bilateral Near:     Physical Exam Constitutional:      General: He is not in acute distress.    Appearance: Normal appearance. He is well-developed. He is not toxic-appearing or diaphoretic.  HENT:     Head: Normocephalic and atraumatic.  Eyes:     Conjunctiva/sclera: Conjunctivae normal.     Pupils: Pupils are equal, round, and reactive to light.  Pulmonary:     Effort: Pulmonary effort is normal. No respiratory distress.     Comments: Speaking in full sentences without difficulty Musculoskeletal:     Cervical back: Normal range of motion and neck supple.  Skin:    General: Skin is warm and dry.  Neurological:     Mental Status: He is alert and oriented to person, place, and time.      UC Treatments / Results  Labs (all labs ordered are listed, but only abnormal results are displayed) Labs Reviewed - No data to display  EKG   Radiology No results found.  Procedures  Procedures (including critical care time)  Medications Ordered in UC Medications - No data to display  Initial  Impression / Assessment and Plan / UC Course  I have reviewed the triage vital signs and the nursing notes.  Pertinent labs & imaging results that were available during my care of the patient were reviewed by me and considered in my medical decision making (see chart for details).    Patient at first also mentioned having headaches, history of daily headaches, states only came in for med refill. Patient brought list of medications in for refill. Will refill for 60 days to cover to patient's scheduled PCP appointment.   Final Clinical Impressions(s) / UC Diagnoses   Final diagnoses:  Medication refill    ED Prescriptions    Medication Sig Dispense Auth. Provider   tamsulosin (FLOMAX) 0.4 MG CAPS capsule Take 1 capsule (0.4 mg total) by mouth daily. 60 capsule Yu, Amy V, PA-C   atorvastatin (LIPITOR) 20 MG tablet Take 1 tablet (20 mg total) by mouth daily. 60 tablet Yu, Amy V, PA-C   sertraline (ZOLOFT) 100 MG tablet Take 1 tablet (100 mg total) by mouth daily. 60 tablet Yu, Amy V, PA-C   omeprazole (PRILOSEC) 20 MG capsule Take 1 capsule (20 mg total) by mouth daily. 60 capsule Yu, Amy V, PA-C   losartan (COZAAR) 100 MG tablet Take 1 tablet (100 mg total) by mouth daily. 60 tablet Belinda Fisher, PA-C     PDMP not reviewed this encounter.   Belinda Fisher, PA-C 11/20/19 2244

## 2019-11-20 NOTE — Discharge Instructions (Signed)
Medications refilled for 60 days. Please follow up with new PCP as scheduled for further evaluation and management needed.

## 2019-11-20 NOTE — ED Triage Notes (Addendum)
[facility] is out of blood pressure medicine for 4 days.  Patient is in between providers.  Patient has a bad headache for 3 days  Patient is out of losartan, will be running out of several meds before appt on 12/25/2019 with new provider

## 2019-12-25 ENCOUNTER — Ambulatory Visit: Payer: Federal, State, Local not specified - PPO | Admitting: Physician Assistant

## 2019-12-25 ENCOUNTER — Encounter: Payer: Self-pay | Admitting: Physician Assistant

## 2019-12-25 ENCOUNTER — Other Ambulatory Visit: Payer: Self-pay

## 2019-12-25 VITALS — BP 139/84 | HR 85 | Temp 98.1°F | Ht 72.0 in | Wt 264.1 lb

## 2019-12-25 DIAGNOSIS — R079 Chest pain, unspecified: Secondary | ICD-10-CM

## 2019-12-25 DIAGNOSIS — F329 Major depressive disorder, single episode, unspecified: Secondary | ICD-10-CM | POA: Diagnosis not present

## 2019-12-25 DIAGNOSIS — Z7689 Persons encountering health services in other specified circumstances: Secondary | ICD-10-CM

## 2019-12-25 DIAGNOSIS — F32A Depression, unspecified: Secondary | ICD-10-CM

## 2019-12-25 DIAGNOSIS — G43909 Migraine, unspecified, not intractable, without status migrainosus: Secondary | ICD-10-CM

## 2019-12-25 DIAGNOSIS — F419 Anxiety disorder, unspecified: Secondary | ICD-10-CM

## 2019-12-25 DIAGNOSIS — G2581 Restless legs syndrome: Secondary | ICD-10-CM

## 2019-12-25 DIAGNOSIS — R1310 Dysphagia, unspecified: Secondary | ICD-10-CM

## 2019-12-25 DIAGNOSIS — I10 Essential (primary) hypertension: Secondary | ICD-10-CM

## 2019-12-25 MED ORDER — DIVALPROEX SODIUM 500 MG PO DR TAB: DELAYED_RELEASE_TABLET | ORAL | 0 refills | Status: DC

## 2019-12-25 MED ORDER — SERTRALINE HCL 100 MG PO TABS: ORAL_TABLET | ORAL | 0 refills | Status: DC

## 2019-12-25 NOTE — Patient Instructions (Signed)

## 2019-12-25 NOTE — Progress Notes (Signed)
establish  New Patient Office Visit  Subjective:  Patient ID: Bryan Ochoa, male    DOB: 11/11/1960  Age: 59 y.o. MRN: [id number]  CC:  Chief Complaint  Patient presents with  . New Patient (Initial Visit)    HPI Bryan Ochoa presents to establish care and to discuss several concerns. Patient has PMHx of several chronic conditions including hypertension, hyperlipidemia, depression, GERD, restless leg syndrome and migraines. Pt reports for the past month he is having difficulty with swallowing solids. States he feels like food gets stuck and sometimes regurgitates food pieces back. He had a colonoscopy done with Dr. Collene Mares and is due for a repeat. He has chronic migraine headaches since adolescence and used to see a neurologist where he lived previously. He was prescribed Depakote which helped and would like a refill. His previous PCP prescribed him Pramipexole for the "jerky leg" which he feels is not helping. States he gets like a twitch in his bilateral calves that seems to be traveling upwards. Pt also reports he lost his son 2 years ago, which significantly affected his mood. States he has a temper and road rage. He is currently on Wellbutrin 150 mg once daily and Zoloft 100 mg, which seemed to work in the beginning but feels like its not as effective anymore. States he has been having left sided chest pain for about 1 month that occurs twice weekly and lasts less than 30 minutes, which he attributes to anxiety or GERD. Denies arm/jaw/neck pain,   nausea, or vomiting.     Past Medical History:  Diagnosis Date  . Chronic daily headache   . Depression   . Deviated nasal septum   . Essential hypertension   . GERD (gastroesophageal reflux disease)   . Insomnia   . Mixed hyperlipidemia   . Nocturia   . Obesity   . RLS (restless legs syndrome)   . Sinusitis   . Sleep apnea     Past Surgical History:  Procedure Laterality Date  . BACK SURGERY    . CHOLECYSTECTOMY, LAPAROSCOPIC    .  NASAL SINUS SURGERY    . SEPTOPLASTY    . WISDOM TOOTH EXTRACTION      Family History  Problem Relation Age of Onset  . Diabetes Mother   . Heart disease Mother   . Hypertension Mother   . Cancer Mother   . Pancreatic cancer Father   . Diabetes Father   . Hypertension Father   . Heart disease Father   . Lung disease Father   . Alcohol abuse Father   . Crohn's disease Sister   . COPD Other        NEPHEW  . Prostate cancer Maternal Grandfather     Social History   Socioeconomic History  . Marital status: Married    Spouse name: Not on file  . Number of children: 2  . Years of education: ASSOCIATES  . Highest education level: Not on file  Occupational History  . Not on file  Tobacco Use  . Smoking status: Former Smoker    Quit date: 06/12/2014    Years since quitting: 5.5  . Smokeless tobacco: Never Used  Substance and Sexual Activity  . Alcohol use: No    Alcohol/week: 0.0 standard drinks  . Drug use: No  . Sexual activity: Not on file  Other Topics Concern  . Not on file  Social History Narrative   Patient is married with 2 children.   Patient is right  handed.   Patient has his Associates degree.   Patient drinks 2 cups daily.   Social Determinants of Health   Financial Resource Strain:   . Difficulty of Paying Living Expenses:   Food Insecurity:   . Worried About Programme researcher, broadcasting/film/video in the Last Year:   . Barista in the Last Year:   Transportation Needs:   . Freight forwarder (Medical):   Marland Kitchen Lack of Transportation (Non-Medical):   Physical Activity:   . Days of Exercise per Week:   . Minutes of Exercise per Session:   Stress:   . Feeling of Stress :   Social Connections:   . Frequency of Communication with Friends and Family:   . Frequency of Social Gatherings with Friends and Family:   . Attends Religious Services:   . Active Member of Clubs or Organizations:   . Attends Banker Meetings:   Marland Kitchen Marital Status:   Intimate  Partner Violence:   . Fear of Current or Ex-Partner:   . Emotionally Abused:   Marland Kitchen Physically Abused:   . Sexually Abused:     ROS Review of Systems  A fourteen system review of systems was performed and found to be positive as per HPI.   Objective:   Today's Vitals: BP 139/84   Pulse 85   Temp 98.1 F (36.7 C) (Oral)   Ht 6' (1.829 m)   Wt 264 lb 1.6 oz (119.8 kg)   SpO2 96%   BMI 35.82 kg/m   Physical Exam General:  Well Developed, in no acute distress, appropriate for stated age.  Neuro:  Alert and oriented, extra-ocular muscles intact  HEENT:  Normocephalic, atraumatic, neck supple Skin:  no gross rash, warm, pink. Cardiac:  RRR, S1 S2 Respiratory:  ECTA B/L and A/P, Not using accessory muscles, speaking in full sentences- unlabored. Vascular:  Ext warm, no cyanosis apprec. Psych:  No HI/SI, judgement and insight appropriate, Mood- stable. Full Affect.   Assessment & Plan:   Problem List Items Addressed This Visit      Other   Depression   Relevant Medications   sertraline (ZOLOFT) 100 MG tablet   Restless legs   Relevant Orders   Ambulatory referral to Neurology    Other Visit Diagnoses    Encounter to establish care    -  Primary   Migraine without status migrainosus, not intractable, unspecified migraine type       Relevant Medications   sertraline (ZOLOFT) 100 MG tablet   divalproex (DEPAKOTE) 500 MG DR tablet   Other Relevant Orders   Ambulatory referral to Neurology   Dysphagia, unspecified type       Relevant Orders   Ambulatory referral to Gastroenterology      Outpatient Encounter Medications as of 12/25/2019  Medication Sig  . aspirin EC 81 MG tablet Take by mouth.  Marland Kitchen atorvastatin (LIPITOR) 20 MG tablet Take 1 tablet (20 mg total) by mouth daily.  Marland Kitchen buPROPion (ZYBAN) 150 MG 12 hr tablet Take 150 mg by mouth daily.   . cetirizine (ZYRTEC) 10 MG tablet Take 10 mg by mouth daily.  Marland Kitchen ELDERBERRY PO Take 75 mg by mouth. 2 PO QD  . losartan  (COZAAR) 100 MG tablet Take 1 tablet (100 mg total) by mouth daily.  . Multiple Vitamin (MULTI-VITAMINS) TABS Take by mouth.  Marland Kitchen omeprazole (PRILOSEC) 20 MG capsule Take 1 capsule (20 mg total) by mouth daily.  . pramipexole (MIRAPEX) 0.25 MG tablet  Take 0.25 mg by mouth in the morning, at noon, and at bedtime.  . tamsulosin (FLOMAX) 0.4 MG CAPS capsule Take 1 capsule (0.4 mg total) by mouth daily.  . [DISCONTINUED] sertraline (ZOLOFT) 100 MG tablet Take 1 tablet (100 mg total) by mouth daily.  . divalproex (DEPAKOTE) 500 MG DR tablet Take 2 tablets by mouth once daily.  . NONFORMULARY OR COMPOUNDED ITEM [facility]: Achilles tendonitis cream - Diclofenac #, Baclofen 2%, Bupivacaine 1%, Gabapentin 6%, Ibuprofen 3%, Pentoxifylline 3%, apply 1-2 grams to affected area 3-4 times daily.  . sertraline (ZOLOFT) 100 MG tablet Take 1.5 tablet by mouth once daily.  . traMADol (ULTRAM) 50 MG tablet Take by mouth every 6 (six) hours as needed.  . [DISCONTINUED] divalproex (DEPAKOTE ER) 500 MG 24 hr tablet TAKE 1 TABLET BY MOUTH EVERY DAY  . [DISCONTINUED] famotidine (PEPCID) 20 MG tablet Take 20 mg by mouth.   No facility-administered encounter medications on file as of 12/25/2019.   HTN: - BP today is 145/93, above goal. Repeat BP is 139/84. - Continue Losartan - Encourage ambulatory BP and pulse monitoring and keep a log. - Follow DASH diet. - Encourage to stay as active as possible.  Chronic migraine: - Provided refill of Depakote. - Placed referral to Neurology to get established with a new neurologist.   Mood, depression, anxiety: - Pt reports mood not well controlled, PHQ9 score of 8 and GAD-7 score of 9 - Continue Wellbutrin 150 mg once daily. - Will increase Zoloft dose to 150 mg once daily. - Follow-up in 8 weeks to reassess symptoms and medication therapy.   Dysphagia:  - Placed Gastroenterology referral to Dr. Loreta Ave for further evaluation.  Chest pain: - Pt's symptoms have been  ongoing for at least a month and denies red flag symptoms so prefers to perform EKG at next OV given he has other concerns to address.  - Pt aware to seek immediate medical care if red flag symptoms develop.    Restless leg syndrome: - Increased dose of pramipexole most likely causing augmentation of symptoms so plan to decrease dose.  - Placed referral to neurology for further management.       Follow-up: Return for Mood, HTN in 8 weeks; CPE and FBW in 3-4 months.   Mayer Masker, PA-C

## 2020-01-01 ENCOUNTER — Telehealth: Payer: Self-pay | Admitting: Physician Assistant

## 2020-01-01 DIAGNOSIS — G43909 Migraine, unspecified, not intractable, without status migrainosus: Secondary | ICD-10-CM

## 2020-01-01 MED ORDER — DIVALPROEX SODIUM 500 MG PO DR TAB: DELAYED_RELEASE_TABLET | ORAL | 0 refills | Status: DC

## 2020-01-01 NOTE — Telephone Encounter (Signed)
Pt's wife called says only 1 pill called into pharmacy for :   divalproex (DEPAKOTE) 500 MG DR tablet [[id number]]   Order Details Dose, Route, Frequency: As Directed  Dispense Quantity: 1 tablet Refills: 0       Sig: Take 2 tablets by mouth once daily.       ----Forwarding message to med asst for review & correction if addt'l tabs should have been gvn.  Pt uses  :   [id number] - [place], [place] - [address] AT [facility] [address]  [address], [place] [address]  Phone:  [phone] Fax:  [phone]   --glh

## 2020-01-01 NOTE — Telephone Encounter (Signed)
Message Received: Yesterday Message Contents  Mayer Masker, PA-C  Stan Head, CMA  Olena Heckle,   Please call Bryan Ochoa and let him know I would like to decrease his pramipexole dose that he takes for his "jerky leg" (restless leg syndrome). After looking into it, restless leg symptoms can worsen and expand to other areas such as the arms when the dose is increased. If patient agreeable, will change dose to 0.125 mg TID. He is currently on 0.25 TID.   Thank you,  Kandis Cocking    Pt's spouse informed of medication correction for Depakote.  Also, advised pt's spouse of the above information.. Requested that Ms. Battaglini discuss this with the patient and either she or the pt call back to inform us if he is agreeable to medication change.  Bryan Ochoa, CMA

## 2020-01-08 DIAGNOSIS — K219 Gastro-esophageal reflux disease without esophagitis: Secondary | ICD-10-CM | POA: Diagnosis not present

## 2020-01-08 DIAGNOSIS — R945 Abnormal results of liver function studies: Secondary | ICD-10-CM | POA: Diagnosis not present

## 2020-01-08 DIAGNOSIS — R1310 Dysphagia, unspecified: Secondary | ICD-10-CM | POA: Diagnosis not present

## 2020-01-08 DIAGNOSIS — Z1211 Encounter for screening for malignant neoplasm of colon: Secondary | ICD-10-CM | POA: Diagnosis not present

## 2020-01-12 ENCOUNTER — Other Ambulatory Visit: Payer: Self-pay | Admitting: Gastroenterology

## 2020-01-12 DIAGNOSIS — R7989 Other specified abnormal findings of blood chemistry: Secondary | ICD-10-CM

## 2020-01-22 ENCOUNTER — Other Ambulatory Visit: Payer: Federal, State, Local not specified - PPO

## 2020-01-23 DIAGNOSIS — Z1211 Encounter for screening for malignant neoplasm of colon: Secondary | ICD-10-CM | POA: Diagnosis not present

## 2020-01-23 DIAGNOSIS — K449 Diaphragmatic hernia without obstruction or gangrene: Secondary | ICD-10-CM | POA: Diagnosis not present

## 2020-01-23 DIAGNOSIS — R1310 Dysphagia, unspecified: Secondary | ICD-10-CM | POA: Diagnosis not present

## 2020-01-23 DIAGNOSIS — K219 Gastro-esophageal reflux disease without esophagitis: Secondary | ICD-10-CM | POA: Diagnosis not present

## 2020-01-23 DIAGNOSIS — K635 Polyp of colon: Secondary | ICD-10-CM | POA: Diagnosis not present

## 2020-01-23 LAB — HM COLONOSCOPY

## 2020-01-26 ENCOUNTER — Other Ambulatory Visit: Payer: Self-pay | Admitting: Gastroenterology

## 2020-01-26 DIAGNOSIS — R1310 Dysphagia, unspecified: Secondary | ICD-10-CM

## 2020-01-29 ENCOUNTER — Ambulatory Visit
Admission: RE | Admit: 2020-01-29 | Discharge: 2020-01-29 | Disposition: A | Discharge status: Home / Self Care | Payer: Federal, State, Local not specified - PPO | Source: Ambulatory Visit | Attending: Gastroenterology | Admitting: Gastroenterology

## 2020-01-29 DIAGNOSIS — R7989 Other specified abnormal findings of blood chemistry: Secondary | ICD-10-CM

## 2020-02-10 ENCOUNTER — Other Ambulatory Visit: Payer: Self-pay | Admitting: Physician Assistant

## 2020-02-10 NOTE — Telephone Encounter (Signed)
Patient seen in our office 12/25/19 as New Patient.    Patient requesting Bupropion 150mg  refill. This med has not been prescribed for this patient by before.    Please approve if refill deemed appropriate. AS, CMA

## 2020-03-05 ENCOUNTER — Ambulatory Visit: Payer: Federal, State, Local not specified - PPO | Admitting: Physician Assistant

## 2020-03-05 ENCOUNTER — Other Ambulatory Visit: Payer: Self-pay

## 2020-03-05 ENCOUNTER — Encounter: Payer: Self-pay | Admitting: Physician Assistant

## 2020-03-05 VITALS — BP 122/74 | HR 69 | Temp 98.0°F | Ht 72.0 in | Wt 266.9 lb

## 2020-03-05 DIAGNOSIS — F329 Major depressive disorder, single episode, unspecified: Secondary | ICD-10-CM | POA: Diagnosis not present

## 2020-03-05 DIAGNOSIS — I10 Essential (primary) hypertension: Secondary | ICD-10-CM | POA: Diagnosis not present

## 2020-03-05 DIAGNOSIS — F419 Anxiety disorder, unspecified: Secondary | ICD-10-CM

## 2020-03-05 DIAGNOSIS — Z0000 Encounter for general adult medical examination without abnormal findings: Secondary | ICD-10-CM | POA: Diagnosis not present

## 2020-03-05 DIAGNOSIS — R079 Chest pain, unspecified: Secondary | ICD-10-CM

## 2020-03-05 DIAGNOSIS — F32A Depression, unspecified: Secondary | ICD-10-CM

## 2020-03-05 NOTE — Progress Notes (Signed)
Established Patient Office Visit  Subjective:  Patient ID: Bryan Ochoa, male    DOB: 10-21-1960  Age: 59 y.o. MRN: [id number]  CC:  Chief Complaint  Patient presents with  . Hypertension  . Depression  . Anxiety    HPI CHRISTPOHER SIEVERS presents for follow-up on hypertension and mood management.  HTN: Pt denies new onset chest pain, palpitations, dizziness or lower extremity swelling.  Patient reports chronic chest pain is better and less frequent, which he feels is probably related to his anxiety.  Agreeable to doing an EKG today. Taking medication as directed without side effects. Pt follows a low salt diet.  Mood: Reports increased dose of Zoloft to 150 has helped improved his symptoms.  States his anger issues are better but sometimes does have some outburst.  Denies SI/HI.  RLS: Reports decreasing dose of Mirapex has helped some with his symptoms.  States he has not heard anything from neurology referral.  Past Medical History:  Diagnosis Date  . Chronic daily headache   . Depression   . Deviated nasal septum   . Essential hypertension   . GERD (gastroesophageal reflux disease)   . Insomnia   . Mixed hyperlipidemia   . Nocturia   . Obesity   . RLS (restless legs syndrome)   . Sinusitis   . Sleep apnea     Past Surgical History:  Procedure Laterality Date  . BACK SURGERY    . CHOLECYSTECTOMY, LAPAROSCOPIC    . NASAL SINUS SURGERY    . SEPTOPLASTY    . WISDOM TOOTH EXTRACTION      Family History  Problem Relation Age of Onset  . Diabetes Mother   . Heart disease Mother   . Hypertension Mother   . Cancer Mother   . Pancreatic cancer Father   . Diabetes Father   . Hypertension Father   . Heart disease Father   . Lung disease Father   . Alcohol abuse Father   . Crohn's disease Sister   . COPD Other        NEPHEW  . Prostate cancer Maternal Grandfather     Social History   Socioeconomic History  . Marital status: Married    Spouse name: Not on  file  . Number of children: 2  . Years of education: ASSOCIATES  . Highest education level: Not on file  Occupational History  . Not on file  Tobacco Use  . Smoking status: Former Smoker    Quit date: 06/12/2014    Years since quitting: 5.7  . Smokeless tobacco: Never Used  Substance and Sexual Activity  . Alcohol use: No    Alcohol/week: 0.0 standard drinks  . Drug use: No  . Sexual activity: Not on file  Other Topics Concern  . Not on file  Social History Narrative   Patient is married with 2 children.   Patient is right handed.   Patient has his Associates degree.   Patient drinks 2 cups daily.   Social Determinants of Health   Financial Resource Strain:   . Difficulty of Paying Living Expenses:   Food Insecurity:   . Worried About Charity fundraiser in the Last Year:   . Arboriculturist in the Last Year:   Transportation Needs:   . Film/video editor (Medical):   Marland Kitchen Lack of Transportation (Non-Medical):   Physical Activity:   . Days of Exercise per Week:   . Minutes of Exercise per Session:  Stress:   . Feeling of Stress :   Social Connections:   . Frequency of Communication with Friends and Family:   . Frequency of Social Gatherings with Friends and Family:   . Attends Religious Services:   . Active Member of Clubs or Organizations:   . Attends Archivist Meetings:   Marland Kitchen Marital Status:   Intimate Partner Violence:   . Fear of Current or Ex-Partner:   . Emotionally Abused:   Marland Kitchen Physically Abused:   . Sexually Abused:     Outpatient Medications Prior to Visit  Medication Sig Dispense Refill  . aspirin EC 81 MG tablet Take by mouth.    Marland Kitchen atorvastatin (LIPITOR) 20 MG tablet Take 1 tablet (20 mg total) by mouth daily. 60 tablet 0  . buPROPion (WELLBUTRIN XL) 150 MG 24 hr tablet TAKE 1 TABLET BY MOUTH EVERY DAY IN THE MORNING 90 tablet 1  . buPROPion (ZYBAN) 150 MG 12 hr tablet Take 150 mg by mouth daily.     . cetirizine (ZYRTEC) 10 MG tablet  Take 10 mg by mouth daily.    . divalproex (DEPAKOTE) 500 MG DR tablet Take 2 tablets by mouth once daily. 180 tablet 0  . ELDERBERRY PO Take 75 mg by mouth. 2 PO QD    . losartan (COZAAR) 100 MG tablet Take 1 tablet (100 mg total) by mouth daily. 60 tablet 0  . Multiple Vitamin (MULTI-VITAMINS) TABS Take by mouth.    . NONFORMULARY OR COMPOUNDED ITEM [facility]: Achilles tendonitis cream - Diclofenac #, Baclofen 2%, Bupivacaine 1%, Gabapentin 6%, Ibuprofen 3%, Pentoxifylline 3%, apply 1-2 grams to affected area 3-4 times daily. 120 each 2  . omeprazole (PRILOSEC) 20 MG capsule Take 1 capsule (20 mg total) by mouth daily. 60 capsule 0  . pramipexole (MIRAPEX) 0.25 MG tablet Take 0.25 mg by mouth in the morning, at noon, and at bedtime.    . tamsulosin (FLOMAX) 0.4 MG CAPS capsule Take 1 capsule (0.4 mg total) by mouth daily. 60 capsule 0  . traMADol (ULTRAM) 50 MG tablet Take by mouth every 6 (six) hours as needed.    . sertraline (ZOLOFT) 100 MG tablet Take 1.5 tablet by mouth once daily. 135 tablet 0   No facility-administered medications prior to visit.    Allergies  Allergen Reactions  . Penicillins Other (See Comments)    Knots all over body. Other reaction(s): Other (See Comments) Knots all over body. Knots all over body.    ROS Review of Systems  A fourteen system review of systems was performed and found to be positive as per HPI.   Objective:    Physical Exam General:  Well Developed, well nourished, appropriate for stated age.  Neuro:  Alert and oriented,  extra-ocular muscles intact  HEENT:  Normocephalic, atraumatic, neck supple Skin:  no gross rash, warm, pink. Cardiac:  RRR, S1 S2 Respiratory:  ECTA B/L, Not using accessory muscles, speaking in full sentences- unlabored. Vascular:  Ext warm, no cyanosis apprec.; cap RF less 2 sec. Psych:  No HI/SI, judgement and insight good, Euthymic mood. Full Affect.  BP 122/74   Pulse 69   Temp 98 F (36.7 C)  (Oral)   Ht 6' (1.829 m)   Wt (!) 266 lb 14.4 oz (121.1 kg)   SpO2 94%   BMI 36.20 kg/m  Wt Readings from Last 3 Encounters:  03/05/20 (!) 266 lb 14.4 oz (121.1 kg)  12/25/19 264 lb 1.6 oz (119.8 kg)  05/10/16 250 lb (113.4 kg)     Health Maintenance Due  Topic Date Due  . Hepatitis C Screening  Never done  . COVID-19 Vaccine (1) Never done  . HIV Screening  Never done  . COLONOSCOPY  Never done  . INFLUENZA VACCINE  03/07/2020    There are no preventive care reminders to display for this patient.  Lab Results  Component Value Date   TSH 3.240 03/05/2020   Lab Results  Component Value Date   WBC 6.9 03/05/2020   HGB 14.8 03/05/2020   HCT 45.9 03/05/2020   MCV 87 03/05/2020   PLT 228 03/05/2020   Lab Results  Component Value Date   NA 138 03/05/2020   K 4.4 03/05/2020   CO2 19 (L) 03/05/2020   GLUCOSE 104 (H) 03/05/2020   BUN 17 03/05/2020   CREATININE 1.00 03/05/2020   BILITOT 0.3 03/05/2020   ALKPHOS 106 03/05/2020   AST 44 (H) 03/05/2020   ALT 60 (H) 03/05/2020   PROT 6.7 03/05/2020   ALBUMIN 4.2 03/05/2020   CALCIUM 9.3 03/05/2020   Lab Results  Component Value Date   CHOL 162 03/05/2020   Lab Results  Component Value Date   HDL 34 (L) 03/05/2020   Lab Results  Component Value Date   LDLCALC 95 03/05/2020   Lab Results  Component Value Date   TRIG 189 (H) 03/05/2020   Lab Results  Component Value Date   CHOLHDL 4.8 03/05/2020   Lab Results  Component Value Date   HGBA1C 6.0 (H) 03/05/2020      Assessment & Plan:   Problem List Items Addressed This Visit      Cardiovascular and Mediastinum   Essential (primary) hypertension - Primary   Relevant Orders   Comp Met (CMET) (Completed)     Other   Depression    Other Visit Diagnoses    Anxiety       Healthcare maintenance       Relevant Orders   Comp Met (CMET) (Completed)   CBC w/Diff (Completed)   TSH (Completed)   HgB A1c (Completed)   Lipid Profile (Completed)   Chest  pain, unspecified type       Relevant Orders   EKG 12-Lead     Essential hypertension: -BP at goal -Continue current medication regimen. -Encourage ambulatory BP and pulse monitoring. -Continue DASH diet. -Stay well-hydrated, at least 64 fluid ounces. -Checking CMP today.  Depression, anxiety: -Stable, PHQ-9 score of 7 and GAD score of 10 -Discussed with patient referral to psychology.  Advised to let me know if would like to pursue. -Continue current medication regimen. -Recommend to incorporate mindfulness therapy. -Will continue to monitor.  Chest pain, chronic: -Most likely related to anxiety. -EKG performed today: NSR, Rate: 68, no acute ST-T changes -If symptoms fail to improve or worsen recommend cardiology referral.    Healthcare maintenance: -Continue current medication regimen. -Patient is fasting so will draw lipid panel, CMP, A1c, CBC with differential and TSH for CPE in 3 months. -Provided patient neurology office information to call and schedule an appointment.  They made several attempts to contact patient, were unsuccessful.   No orders of the defined types were placed in this encounter.   Follow-up: Return in about 3 months (around 06/05/2020) for CPE.    Lorrene Reid, PA-C

## 2020-03-05 NOTE — Patient Instructions (Signed)
Contact Neurology to schedule appointment  Managing Anger, Adult Everyone feels angry from time to time. It is okay and normal to feel angry. However, the way that you behave or react to anger can make it a problem. How can anger affect me? Reacting too strongly to anger, acting out in anger, or not expressing it at all can:  Cause relationship problems at home and work.  Trigger stress-related problems, such as headaches, poor digestion, or trouble sleeping.  Affect your health. Uncontrolled anger increases your risk of heart disease. When you are angry, your heart rate and blood pressure rise. Levels of certain hormones, such as adrenaline, also increase. When this happens, your heart has to work harder. In extreme cases, anger can cause the blood vessels to become narrow. This reduces the supply of blood and oxygen to the heart, and that can trigger chest pain (angina). What actions can I take to manage my anger?     You can take actions to help you manage your anger. For example:  Express your anger in a healthy way by using the following strategies: ? Step away. When you are feeling reactive, it may take at least 20 minutes for your body to return to its normal blood pressure and heart rate. To help your body do this, take a walk, listen to music, stretch, take deep breaths, and avoid the person or situation that has you feeling angry. Try to discuss your anger when you feel calm again. ? Consider how others may feel before you react. Avoid swearing, sighing, raising your voice, or blaming. ? Realize that you can have the feeling of anger, but you do not have to become the feeling. All feelings happen and then weaken over time. ? Choose a good time to work through problems and reach agreement with the other person. You may be more likely to lose your temper at the end of the day when you are tired. The other person may also still be reactive for a while. Set a time and place to come back  together that works for both of you. ? Keep a journal of your feelings. Write down situations in which you become angry. This helps you know your feelings and may help you figure out what triggers your anger.  Consider changing your perception. Is there another way you can view the situation that will leave you with a different emotion? Sometimes, changing the way you think about a situation can make it seem less infuriating. Here are some ways to do that: ? Remind yourself that everyone is not out to get you. ? Remind yourself that a disappointing result is not the end of the world. You can cope with being disappointed. ? Take steps to solve or prevent the situation that upsets you. ? Find the humor in an aggravating situation. ? Deal with the physical effects of anger by taking deep breaths, exercising, or taking a walk. ? Slowly repeat the word "relax" or another calming phrase. ? Practice letting go of whatever is making you angry. Realize that many things that may anger you are outside of your control and you cannot do anything about them. ? Picture a relaxing image in your mind. Close your eyes and use that image to help you calm yourself. What are signs of an anger-related problem? Anger becomes a problem if it occurs frequently and lasts for long periods of time. You may also need help managing your anger if:  You use physical force or  aggression when you are angry and others around you feel threatened and fearful.  You feel that your anger is out of control.  Anger is interfering with your job.  Anger is causing problems with your health.  Anger is causing problems with your relationships.  Anger is affecting your ability to tolerate normal daily situations, such as sitting in a traffic jam or waiting in line.  You treat others disrespectfully.  You do not trust people around you. It may help to ask someone you trust whether he or she thinks you show any of these signs.  Sometimes, it can be hard to recognize the problem yourself. Where to find support To find support on how to manage your anger:  Contact a licensed mental health professional directly.  Call your health care provider for a referral to a mental health professional.  Look online to find a psychologist who specializes in anger management.  Search websites of mental health organizations to find a mental health care provider.  Look online for a local office of the Domestic Abuse Project. Your local hospital or behavioral counselors in your area may also offer anger management programs or support groups that can help. Where to find more information  American Psychological Association: DiceTournament.ca  [employer] on Domestic Violence: RentalRefinancing.at Summary  It is normal for everyone to feel angry at times. However, anger becomes a problem if it occurs frequently and lasts for long periods of time.  Health and relationship problems can develop when you react too strongly to anger, act out in anger, or do not express your anger at all.  You can use strategies to help you express your anger in a healthy way.  Learn to change your perception. Sometimes, changing the way you think about a situation can make it seem less infuriating.  Contact your health care provider or a mental health professional if you need help managing your anger. This information is not intended to replace advice given to you by your health care provider. Make sure you discuss any questions you have with your health care provider. Document Revised: 07/23/2018 Document Reviewed: 07/23/2018 Elsevier Patient Education  2020 [employer].

## 2020-03-06 LAB — COMPREHENSIVE METABOLIC PANEL
ALT: 60 IU/L — ABNORMAL HIGH (ref 0–44)
AST: 44 IU/L — ABNORMAL HIGH (ref 0–40)
Albumin/Globulin Ratio: 1.7 (ref 1.2–2.2)
Albumin: 4.2 g/dL (ref 3.8–4.9)
Alkaline Phosphatase: 106 IU/L (ref 48–121)
BUN/Creatinine Ratio: 17 (ref 9–20)
BUN: 17 mg/dL (ref 6–24)
Bilirubin Total: 0.3 mg/dL (ref 0.0–1.2)
CO2: 19 mmol/L — ABNORMAL LOW (ref 20–29)
Calcium: 9.3 mg/dL (ref 8.7–10.2)
Chloride: 107 mmol/L — ABNORMAL HIGH (ref 96–106)
Creatinine, Ser: 1 mg/dL (ref 0.76–1.27)
GFR calc Af Amer: 96 mL/min/{1.73_m2} (ref >59)
GFR calc non Af Amer: 83 mL/min/{1.73_m2} (ref >59)
Globulin, Total: 2.5 g/dL (ref 1.5–4.5)
Glucose: 104 mg/dL — ABNORMAL HIGH (ref 65–99)
Potassium: 4.4 mmol/L (ref 3.5–5.2)
Sodium: 138 mmol/L (ref 134–144)
Total Protein: 6.7 g/dL (ref 6.0–8.5)

## 2020-03-06 LAB — CBC WITH DIFFERENTIAL/PLATELET
Basophils Absolute: 0.1 10*3/uL (ref 0.0–0.2)
Basos: 1 %
EOS (ABSOLUTE): 0.4 10*3/uL (ref 0.0–0.4)
Eos: 6 %
Hematocrit: 45.9 % (ref 37.5–51.0)
Hemoglobin: 14.8 g/dL (ref 13.0–17.7)
Immature Grans (Abs): 0 10*3/uL (ref 0.0–0.1)
Immature Granulocytes: 0 %
Lymphocytes Absolute: 2.9 10*3/uL (ref 0.7–3.1)
Lymphs: 43 %
MCH: 28.1 pg (ref 26.6–33.0)
MCHC: 32.2 g/dL (ref 31.5–35.7)
MCV: 87 fL (ref 79–97)
Monocytes Absolute: 0.7 10*3/uL (ref 0.1–0.9)
Monocytes: 10 %
Neutrophils Absolute: 2.8 10*3/uL (ref 1.4–7.0)
Neutrophils: 40 %
Platelets: 228 10*3/uL (ref 150–450)
RBC: 5.27 x10E6/uL (ref 4.14–5.80)
RDW: 13.9 % (ref 11.6–15.4)
WBC: 6.9 10*3/uL (ref 3.4–10.8)

## 2020-03-06 LAB — LIPID PANEL
Chol/HDL Ratio: 4.8 ratio (ref 0.0–5.0)
Cholesterol, Total: 162 mg/dL (ref 100–199)
HDL: 34 mg/dL — ABNORMAL LOW (ref >39)
LDL Chol Calc (NIH): 95 mg/dL (ref 0–99)
Triglycerides: 189 mg/dL — ABNORMAL HIGH (ref 0–149)
VLDL Cholesterol Cal: 33 mg/dL (ref 5–40)

## 2020-03-06 LAB — TSH: TSH: 3.24 u[IU]/mL (ref 0.450–4.500)

## 2020-03-06 LAB — HEMOGLOBIN A1C
Est. average glucose Bld gHb Est-mCnc: 126 mg/dL
Hgb A1c MFr Bld: 6 % — ABNORMAL HIGH (ref 4.8–5.6)

## 2020-03-18 ENCOUNTER — Other Ambulatory Visit: Payer: Self-pay | Admitting: Physician Assistant

## 2020-03-18 DIAGNOSIS — F32A Depression, unspecified: Secondary | ICD-10-CM

## 2020-03-18 DIAGNOSIS — F329 Major depressive disorder, single episode, unspecified: Secondary | ICD-10-CM

## 2020-03-24 ENCOUNTER — Other Ambulatory Visit: Payer: Self-pay | Admitting: Physician Assistant

## 2020-03-24 DIAGNOSIS — G43909 Migraine, unspecified, not intractable, without status migrainosus: Secondary | ICD-10-CM

## 2020-04-06 ENCOUNTER — Encounter: Payer: Self-pay | Admitting: Physician Assistant

## 2020-04-19 ENCOUNTER — Ambulatory Visit
Admission: EM | Admit: 2020-04-19 | Discharge: 2020-04-19 | Disposition: A | Discharge status: Home / Self Care | Payer: Federal, State, Local not specified - PPO | Attending: Physician Assistant | Admitting: Physician Assistant

## 2020-04-19 ENCOUNTER — Other Ambulatory Visit: Payer: Self-pay

## 2020-04-19 DIAGNOSIS — M545 Low back pain, unspecified: Secondary | ICD-10-CM

## 2020-04-19 MED ORDER — DEXAMETHASONE SODIUM PHOSPHATE 10 MG/ML IJ SOLN
10.0000 mg | Freq: Once | INTRAMUSCULAR | Status: AC
  Administered 2020-04-19: 10 mg via INTRAMUSCULAR

## 2020-04-19 MED ORDER — TIZANIDINE HCL 2 MG PO TABS: 2.0000 mg | ORAL_TABLET | Freq: Three times a day (TID) | ORAL | 0 refills | Status: DC | PRN

## 2020-04-19 MED ORDER — MELOXICAM 7.5 MG PO TABS: 7.5000 mg | ORAL_TABLET | Freq: Every day | ORAL | 0 refills | Status: DC

## 2020-04-19 NOTE — ED Triage Notes (Signed)
Pt states he had lower back surgery approximately 1 year ago. Pt has experienced sharp lower back pain worsening over the last 4 days. Pt is aox4 and ambulates with pain.

## 2020-04-19 NOTE — Discharge Instructions (Signed)
Decadron injection in office today. Start Mobic. Do not take ibuprofen (motrin/advil)/ naproxen (aleve) while on mobic. Robaxin as needed, this can make you drowsy, so do not take if you are going to drive, operate heavy machinery, or make important decisions. Ice/heat compresses as needed. Follow up with PCP/orthopedics if symptoms worsen, changes for reevaluation. If experience numbness/tingling of the inner thighs, loss of bladder or bowel control, go to the emergency department for evaluation.

## 2020-04-19 NOTE — ED Provider Notes (Signed)
[facility]    CSN: [id number] Arrival date & time: 04/19/20  1852      History   Chief Complaint Chief Complaint  Patient presents with  . Back Pain    x 4 days    HPI Bryan Ochoa is a 59 y.o. male.   59 year old male comes in for acute on chronic low back pain.  Patient had lumbar surgery approximately 1 year ago, and no longer follows with orthopedics.  States will have intermittent low back pain at times, but usually resolves after 1 to 2 days.  Current symptoms started 4 days ago.  Pain is constant, worse with movement without radiation of pain.  Denies numbness, tingling, saddle anesthesia, loss of bladder or bowel control.  Denies new injury/trauma.  Has had increase in activity, and thinks this may have flared symptoms up.  Tylenol without relief.     Past Medical History:  Diagnosis Date  . Chronic daily headache   . Depression   . Deviated nasal septum   . Essential hypertension   . GERD (gastroesophageal reflux disease)   . Insomnia   . Mixed hyperlipidemia   . Nocturia   . Obesity   . RLS (restless legs syndrome)   . Sinusitis   . Sleep apnea     Patient Active Problem List   Diagnosis Date Noted  . Serum calcium elevated 04/10/2019  . Restless legs 02/28/2018  . Chronic pansinusitis 06/11/2017  . Deviated nasal septum 06/11/2017  . Hypertrophy of inferior nasal turbinate 06/11/2017  . Nasal obstruction 06/11/2017  . Nocturia 04/24/2017  . Recurrent sinusitis 09/05/2016  . Depression 10/26/2015  . GERD (gastroesophageal reflux disease) 10/26/2015  . Mixed hyperlipidemia 10/26/2015  . Obesity 09/29/2014  . Primary snoring 09/29/2014  . Cerebral infarction due to unspecified mechanism 09/29/2014  . Chronic daily headache 06/19/2014  . Analgesic rebound headache 06/19/2014  . Sleep apnea 06/19/2014  . Drug-induced headache, not elsewhere classified, not intractable 06/19/2014  . Essential (primary) hypertension 05/12/2014  . Low  back pain 05/12/2014    Past Surgical History:  Procedure Laterality Date  . BACK SURGERY    . CHOLECYSTECTOMY, LAPAROSCOPIC    . NASAL SINUS SURGERY    . SEPTOPLASTY    . WISDOM TOOTH EXTRACTION         Home Medications    Prior to Admission medications   Medication Sig Start Date End Date Taking? Authorizing Provider  aspirin EC 81 MG tablet Take by mouth.    [provider]  atorvastatin (LIPITOR) 20 MG tablet Take 1 tablet (20 mg total) by mouth daily. 11/20/19   Cathie Hoops, Janziel Hockett V, PA-C  buPROPion (WELLBUTRIN XL) 150 MG 24 hr tablet TAKE 1 TABLET BY MOUTH EVERY DAY IN THE MORNING 02/11/20   Abonza, Maritza, PA-C  buPROPion (ZYBAN) 150 MG 12 hr tablet Take 150 mg by mouth daily.     [provider]  cetirizine (ZYRTEC) 10 MG tablet Take 10 mg by mouth daily.    [provider]  divalproex (DEPAKOTE) 500 MG DR tablet TAKE 2 TABLETS BY MOUTH EVERY DAY 03/24/20   Abonza, Maritza, PA-C  ELDERBERRY PO Take 75 mg by mouth. 2 PO QD    [provider]  losartan (COZAAR) 100 MG tablet Take 1 tablet (100 mg total) by mouth daily. 11/20/19   Cathie Hoops, Petra Sargeant V, PA-C  meloxicam (MOBIC) 7.5 MG tablet Take 1 tablet (7.5 mg total) by mouth daily. 04/19/20   Belinda Fisher,  PA-C  Multiple Vitamin (MULTI-VITAMINS) TABS Take by mouth.    [provider]  NONFORMULARY OR COMPOUNDED ITEM [facility]: Achilles tendonitis cream - Diclofenac #, Baclofen 2%, Bupivacaine 1%, Gabapentin 6%, Ibuprofen 3%, Pentoxifylline 3%, apply 1-2 grams to affected area 3-4 times daily. 09/13/16   Felecia Shelling, DPM  omeprazole (PRILOSEC) 20 MG capsule Take 1 capsule (20 mg total) by mouth daily. 11/20/19   Cathie Hoops, Tichina Koebel V, PA-C  pramipexole (MIRAPEX) 0.25 MG tablet Take 0.25 mg by mouth in the morning, at noon, and at bedtime. 10/07/19   [provider]  sertraline (ZOLOFT) 100 MG tablet TAKE 1&1/2 TABLETS BY MOUTH DAILY 03/18/20   Mayer Masker, PA-C  tamsulosin (FLOMAX) 0.4 MG CAPS capsule Take  1 capsule (0.4 mg total) by mouth daily. 11/20/19   Cathie Hoops, Dash Cardarelli V, PA-C  tiZANidine (ZANAFLEX) 2 MG tablet Take 1 tablet (2 mg total) by mouth every 8 (eight) hours as needed for muscle spasms. 04/19/20   Cathie Hoops, Jerianne Anselmo V, PA-C  traMADol (ULTRAM) 50 MG tablet Take by mouth every 6 (six) hours as needed.    [provider]  famotidine (PEPCID) 20 MG tablet Take 20 mg by mouth.  11/20/19  [provider]    Family History Family History  Problem Relation Age of Onset  . Diabetes Mother   . Heart disease Mother   . Hypertension Mother   . Cancer Mother   . Pancreatic cancer Father   . Diabetes Father   . Hypertension Father   . Heart disease Father   . Lung disease Father   . Alcohol abuse Father   . Crohn's disease Sister   . COPD Other        NEPHEW  . Prostate cancer Maternal Grandfather     Social History Social History   Tobacco Use  . Smoking status: Former Smoker    Quit date: 06/12/2014    Years since quitting: 5.8  . Smokeless tobacco: Never Used  Vaping Use  . Vaping Use: Never used  Substance Use Topics  . Alcohol use: No    Alcohol/week: 0.0 standard drinks  . Drug use: No     Allergies   Penicillins   Review of Systems Review of Systems  Reason unable to perform ROS: See HPI as above.     Physical Exam Triage Vital Signs ED Triage Vitals  Enc Vitals Group     BP 04/19/20 1905 121/86     Pulse Rate 04/19/20 1905 81     Resp 04/19/20 1905 20     Temp 04/19/20 1905 98.3 F (36.8 C)     Temp Source 04/19/20 1905 Oral     SpO2 04/19/20 1905 96 %     Weight --      Height --      Head Circumference --      Peak Flow --      Pain Score 04/19/20 1907 5     Pain Loc --      Pain Edu? --      Excl. in GC? --    No data found.  Updated Vital Signs BP 121/86 (BP Location: Left Arm)   Pulse 81   Temp 98.3 F (36.8 C) (Oral)   Resp 20   SpO2 96%   Physical Exam Constitutional:      General: He is not in acute distress.    Appearance:  He is well-developed. He is not diaphoretic.  HENT:  Head: Normocephalic and atraumatic.  Eyes:     Conjunctiva/sclera: Conjunctivae normal.     Pupils: Pupils are equal, round, and reactive to light.  Cardiovascular:     Rate and Rhythm: Normal rate and regular rhythm.     Heart sounds: Normal heart sounds. No murmur heard.  No friction rub. No gallop.   Pulmonary:     Effort: Pulmonary effort is normal. No accessory muscle usage or respiratory distress.     Breath sounds: Normal breath sounds. No stridor. No decreased breath sounds, wheezing, rhonchi or rales.  Musculoskeletal:     Comments: No tenderness to palpation of spinous processes. Tenderness to palpation of bilateral lumbar region. Decreased ROM of back, full ROM of hips. Sensation intact. Negative straight leg raise.  Skin:    General: Skin is warm and dry.  Neurological:     Mental Status: He is alert and oriented to person, place, and time.      UC Treatments / Results  Labs (all labs ordered are listed, but only abnormal results are displayed) Labs Reviewed - No data to display  EKG   Radiology No results found.  Procedures Procedures (including critical care time)  Medications Ordered in UC Medications  dexamethasone (DECADRON) injection 10 mg (has no administration in time range)    Initial Impression / Assessment and Plan / UC Course  I have reviewed the triage vital signs and the nursing notes.  Pertinent labs & imaging results that were available during my care of the patient were reviewed by me and considered in my medical decision making (see chart for details).    Decadron injection in office today. NSAID as directed. Muscle relaxant as needed. Ice/heat compresses. Expected course of healing discussed. Return precautions given.   Final Clinical Impressions(s) / UC Diagnoses   Final diagnoses:  Acute bilateral low back pain without sciatica    ED Prescriptions    Medication Sig  Dispense Auth. Provider   tiZANidine (ZANAFLEX) 2 MG tablet Take 1 tablet (2 mg total) by mouth every 8 (eight) hours as needed for muscle spasms. 15 tablet Marice Guidone V, PA-C   meloxicam (MOBIC) 7.5 MG tablet Take 1 tablet (7.5 mg total) by mouth daily. 10 tablet Belinda Fisher, PA-C     I have reviewed the PDMP during this encounter.   Belinda Fisher, PA-C 04/19/20 1947

## 2020-04-21 ENCOUNTER — Ambulatory Visit: Payer: Federal, State, Local not specified - PPO | Admitting: Podiatry

## 2020-04-22 ENCOUNTER — Other Ambulatory Visit: Payer: Self-pay | Admitting: Physician Assistant

## 2020-04-29 DIAGNOSIS — K449 Diaphragmatic hernia without obstruction or gangrene: Secondary | ICD-10-CM | POA: Diagnosis not present

## 2020-04-29 DIAGNOSIS — Z1211 Encounter for screening for malignant neoplasm of colon: Secondary | ICD-10-CM | POA: Diagnosis not present

## 2020-04-29 DIAGNOSIS — M5416 Radiculopathy, lumbar region: Secondary | ICD-10-CM | POA: Diagnosis not present

## 2020-04-29 DIAGNOSIS — K219 Gastro-esophageal reflux disease without esophagitis: Secondary | ICD-10-CM | POA: Diagnosis not present

## 2020-04-29 DIAGNOSIS — Z6836 Body mass index (BMI) 36.0-36.9, adult: Secondary | ICD-10-CM | POA: Diagnosis not present

## 2020-04-29 DIAGNOSIS — R945 Abnormal results of liver function studies: Secondary | ICD-10-CM | POA: Diagnosis not present

## 2020-05-07 ENCOUNTER — Other Ambulatory Visit: Payer: Self-pay | Admitting: Physician Assistant

## 2020-05-07 DIAGNOSIS — R351 Nocturia: Secondary | ICD-10-CM

## 2020-05-07 DIAGNOSIS — K219 Gastro-esophageal reflux disease without esophagitis: Secondary | ICD-10-CM

## 2020-05-10 NOTE — Telephone Encounter (Signed)
We have not prescribed these medications for the patient previously.  Please review and refill if appropriate.  T. Nashonda Limberg, CMA  

## 2020-05-31 ENCOUNTER — Telehealth: Payer: Self-pay

## 2020-05-31 NOTE — Telephone Encounter (Signed)
RX for atorvastatin denied.  Pt needs OV.  Tiajuana Amass, CMA

## 2020-06-03 ENCOUNTER — Other Ambulatory Visit: Payer: Self-pay | Admitting: Physician Assistant

## 2020-06-18 ENCOUNTER — Encounter: Payer: Federal, State, Local not specified - PPO | Admitting: Physician Assistant

## 2020-06-23 ENCOUNTER — Other Ambulatory Visit (HOSPITAL_COMMUNITY): Payer: Self-pay | Admitting: Gastroenterology

## 2020-06-23 DIAGNOSIS — E669 Obesity, unspecified: Secondary | ICD-10-CM | POA: Diagnosis not present

## 2020-06-23 DIAGNOSIS — R7401 Elevation of levels of liver transaminase levels: Secondary | ICD-10-CM | POA: Diagnosis not present

## 2020-06-23 DIAGNOSIS — B1920 Unspecified viral hepatitis C without hepatic coma: Secondary | ICD-10-CM

## 2020-06-23 DIAGNOSIS — R768 Other specified abnormal immunological findings in serum: Secondary | ICD-10-CM | POA: Diagnosis not present

## 2020-06-24 ENCOUNTER — Other Ambulatory Visit: Payer: Self-pay | Admitting: Physician Assistant

## 2020-06-24 DIAGNOSIS — F32A Depression, unspecified: Secondary | ICD-10-CM

## 2020-06-24 DIAGNOSIS — G43909 Migraine, unspecified, not intractable, without status migrainosus: Secondary | ICD-10-CM

## 2020-07-07 ENCOUNTER — Encounter: Payer: Federal, State, Local not specified - PPO | Admitting: Physician Assistant

## 2020-07-08 ENCOUNTER — Ambulatory Visit (HOSPITAL_COMMUNITY): Payer: Federal, State, Local not specified - PPO

## 2020-07-15 ENCOUNTER — Ambulatory Visit (HOSPITAL_COMMUNITY): Payer: Federal, State, Local not specified - PPO

## 2020-07-15 ENCOUNTER — Encounter (HOSPITAL_COMMUNITY): Payer: Self-pay

## 2020-07-18 ENCOUNTER — Other Ambulatory Visit: Payer: Self-pay | Admitting: Physician Assistant

## 2020-07-22 ENCOUNTER — Encounter: Payer: Federal, State, Local not specified - PPO | Admitting: Physician Assistant

## 2020-08-01 ENCOUNTER — Other Ambulatory Visit: Payer: Self-pay | Admitting: Physician Assistant

## 2020-08-03 LAB — EXTERNAL GENERIC LAB PROCEDURE: COLOGUARD: NEGATIVE

## 2020-08-03 LAB — COLOGUARD
COLOGUARD: NEGATIVE
Cologuard: NEGATIVE

## 2020-08-06 ENCOUNTER — Other Ambulatory Visit: Payer: Self-pay | Admitting: Physician Assistant

## 2020-08-06 DIAGNOSIS — R351 Nocturia: Secondary | ICD-10-CM

## 2020-08-06 DIAGNOSIS — K219 Gastro-esophageal reflux disease without esophagitis: Secondary | ICD-10-CM

## 2020-08-16 ENCOUNTER — Encounter: Payer: Self-pay | Admitting: Physician Assistant

## 2020-08-21 ENCOUNTER — Other Ambulatory Visit: Payer: Self-pay | Admitting: Physician Assistant

## 2020-08-21 DIAGNOSIS — G43909 Migraine, unspecified, not intractable, without status migrainosus: Secondary | ICD-10-CM

## 2020-08-26 ENCOUNTER — Telehealth: Payer: Self-pay | Admitting: Physician Assistant

## 2020-08-26 ENCOUNTER — Other Ambulatory Visit: Payer: Self-pay | Admitting: Physician Assistant

## 2020-08-26 DIAGNOSIS — F32A Depression, unspecified: Secondary | ICD-10-CM

## 2020-08-26 NOTE — Telephone Encounter (Signed)
Please contact patient to schedule apt for further refills per last AVS. AS, CMA

## 2020-08-26 NOTE — Telephone Encounter (Signed)
Left voicemail 08-26-20

## 2020-08-31 ENCOUNTER — Other Ambulatory Visit: Payer: Self-pay | Admitting: Physician Assistant

## 2020-08-31 DIAGNOSIS — G43909 Migraine, unspecified, not intractable, without status migrainosus: Secondary | ICD-10-CM

## 2020-09-02 ENCOUNTER — Other Ambulatory Visit: Payer: Self-pay | Admitting: Physician Assistant

## 2020-09-02 DIAGNOSIS — R351 Nocturia: Secondary | ICD-10-CM

## 2020-09-10 ENCOUNTER — Ambulatory Visit (HOSPITAL_COMMUNITY): Admission: RE | Admit: 2020-09-10 | Payer: Federal, State, Local not specified - PPO | Source: Ambulatory Visit

## 2020-09-22 ENCOUNTER — Other Ambulatory Visit: Payer: Self-pay | Admitting: Physician Assistant

## 2020-09-22 DIAGNOSIS — F32A Depression, unspecified: Secondary | ICD-10-CM

## 2020-09-24 ENCOUNTER — Other Ambulatory Visit: Payer: Self-pay | Admitting: Physician Assistant

## 2020-10-02 ENCOUNTER — Other Ambulatory Visit: Payer: Self-pay | Admitting: Physician Assistant

## 2020-10-02 DIAGNOSIS — K219 Gastro-esophageal reflux disease without esophagitis: Secondary | ICD-10-CM

## 2020-10-04 ENCOUNTER — Encounter: Payer: Federal, State, Local not specified - PPO | Admitting: Physician Assistant

## 2020-10-08 ENCOUNTER — Other Ambulatory Visit: Payer: Self-pay

## 2020-10-08 ENCOUNTER — Ambulatory Visit (HOSPITAL_COMMUNITY)
Admission: RE | Admit: 2020-10-08 | Discharge: 2020-10-08 | Disposition: A | Discharge status: Home / Self Care | Payer: Federal, State, Local not specified - PPO | Source: Ambulatory Visit | Attending: Gastroenterology | Admitting: Gastroenterology

## 2020-10-08 ENCOUNTER — Other Ambulatory Visit (HOSPITAL_COMMUNITY): Payer: Self-pay | Admitting: Gastroenterology

## 2020-10-08 DIAGNOSIS — K760 Fatty (change of) liver, not elsewhere classified: Secondary | ICD-10-CM | POA: Diagnosis not present

## 2020-10-08 DIAGNOSIS — B182 Chronic viral hepatitis C: Secondary | ICD-10-CM

## 2020-10-21 ENCOUNTER — Encounter: Payer: Self-pay | Admitting: Physician Assistant

## 2020-10-21 ENCOUNTER — Ambulatory Visit (INDEPENDENT_AMBULATORY_CARE_PROVIDER_SITE_OTHER): Payer: Federal, State, Local not specified - PPO | Admitting: Physician Assistant

## 2020-10-21 ENCOUNTER — Other Ambulatory Visit: Payer: Self-pay

## 2020-10-21 VITALS — BP 124/80 | HR 67 | Temp 96.1°F | Ht 72.0 in | Wt 277.2 lb

## 2020-10-21 DIAGNOSIS — Z125 Encounter for screening for malignant neoplasm of prostate: Secondary | ICD-10-CM

## 2020-10-21 DIAGNOSIS — Z6837 Body mass index (BMI) 37.0-37.9, adult: Secondary | ICD-10-CM

## 2020-10-21 DIAGNOSIS — F32A Depression, unspecified: Secondary | ICD-10-CM | POA: Diagnosis not present

## 2020-10-21 DIAGNOSIS — Z0000 Encounter for general adult medical examination without abnormal findings: Secondary | ICD-10-CM | POA: Diagnosis not present

## 2020-10-21 DIAGNOSIS — F339 Major depressive disorder, recurrent, unspecified: Secondary | ICD-10-CM | POA: Diagnosis not present

## 2020-10-21 DIAGNOSIS — R351 Nocturia: Secondary | ICD-10-CM

## 2020-10-21 DIAGNOSIS — F419 Anxiety disorder, unspecified: Secondary | ICD-10-CM

## 2020-10-21 DIAGNOSIS — I10 Essential (primary) hypertension: Secondary | ICD-10-CM | POA: Diagnosis not present

## 2020-10-21 DIAGNOSIS — E782 Mixed hyperlipidemia: Secondary | ICD-10-CM

## 2020-10-21 MED ORDER — ATORVASTATIN CALCIUM 20 MG PO TABS
20.0000 mg | ORAL_TABLET | Freq: Every day | ORAL | 1 refills | Status: DC
Start: 2020-10-21 — End: 2021-04-29

## 2020-10-21 MED ORDER — SERTRALINE HCL 100 MG PO TABS: 150.0000 mg | ORAL_TABLET | Freq: Every day | ORAL | 1 refills | Status: DC

## 2020-10-21 MED ORDER — SAXENDA 18 MG/3ML ~~LOC~~ SOPN: PEN_INJECTOR | SUBCUTANEOUS | 1 refills | Status: DC

## 2020-10-21 MED ORDER — TAMSULOSIN HCL 0.4 MG PO CAPS: 0.4000 mg | ORAL_CAPSULE | Freq: Every day | ORAL | 1 refills | Status: DC

## 2020-10-21 NOTE — Patient Instructions (Signed)
Mediterranean Diet A Mediterranean diet refers to food and lifestyle choices that are based on the traditions of countries located on the [place]. This way of eating has been shown to help prevent certain conditions and improve outcomes for people who have chronic diseases, like kidney disease and heart disease. What are tips for following this plan? Lifestyle  Cook and eat meals together with your family, when possible.  Drink enough fluid to keep your urine clear or pale yellow.  Be physically active every day. This includes: ? Aerobic exercise like running or swimming. ? Leisure activities like gardening, walking, or housework.  Get 7-8 hours of sleep each night.  If recommended by your health care provider, drink red wine in moderation. This means 1 glass a day for nonpregnant women and 2 glasses a day for men. A glass of wine equals 5 oz (150 mL). Reading food labels  Check the serving size of packaged foods. For foods such as rice and pasta, the serving size refers to the amount of cooked product, not dry.  Check the total fat in packaged foods. Avoid foods that have saturated fat or trans fats.  Check the ingredients list for added sugars, such as corn syrup.   Shopping  At the grocery store, buy most of your food from the areas near the walls of the store. This includes: ? Fresh fruits and vegetables (produce). ? Grains, beans, nuts, and seeds. Some of these may be available in unpackaged forms or large amounts (in bulk). ? Fresh seafood. ? Poultry and eggs. ? Low-fat dairy products.  Buy whole ingredients instead of prepackaged foods.  Buy fresh fruits and vegetables in-season from local farmers markets.  Buy frozen fruits and vegetables in resealable bags.  If you do not have access to quality fresh seafood, buy precooked frozen shrimp or canned fish, such as tuna, salmon, or sardines.  Buy small amounts of raw or cooked vegetables, salads, or olives from  the deli or salad bar at your store.  Stock your pantry so you always have certain foods on hand, such as olive oil, canned tuna, canned tomatoes, rice, pasta, and beans. Cooking  Cook foods with extra-virgin olive oil instead of using butter or other vegetable oils.  Have meat as a side dish, and have vegetables or grains as your main dish. This means having meat in small portions or adding small amounts of meat to foods like pasta or stew.  Use beans or vegetables instead of meat in common dishes like chili or lasagna.  Experiment with different cooking methods. Try roasting or broiling vegetables instead of steaming or sauteing them.  Add frozen vegetables to soups, stews, pasta, or rice.  Add nuts or seeds for added healthy fat at each meal. You can add these to yogurt, salads, or vegetable dishes.  Marinate fish or vegetables using olive oil, lemon juice, garlic, and fresh herbs. Meal planning  Plan to eat 1 vegetarian meal one day each week. Try to work up to 2 vegetarian meals, if possible.  Eat seafood 2 or more times a week.  Have healthy snacks readily available, such as: ? Vegetable sticks with hummus. ? Greek yogurt. ? Fruit and nut trail mix.  Eat balanced meals throughout the week. This includes: ? Fruit: 2-3 servings a day ? Vegetables: 4-5 servings a day ? Low-fat dairy: 2 servings a day ? Fish, poultry, or lean meat: 1 serving a day ? Beans and legumes: 2 or more servings a week ?   Nuts and seeds: 1-2 servings a day ? Whole grains: 6-8 servings a day ? Extra-virgin olive oil: 3-4 servings a day  Limit red meat and sweets to only a few servings a month   What are my food choices?  Mediterranean diet ? Recommended  Grains: Whole-grain pasta. Brown rice. Bulgar wheat. Polenta. Couscous. Whole-wheat bread. Oatmeal. Quinoa.  Vegetables: Artichokes. Beets. Broccoli. Cabbage. Carrots. Eggplant. Green beans. Chard. Kale. Spinach. Onions. Leeks. Peas. Squash.  Tomatoes. Peppers. Radishes.  Fruits: Apples. Apricots. Avocado. Berries. Bananas. Cherries. Dates. Figs. Grapes. Lemons. Melon. Oranges. Peaches. Plums. Pomegranate.  Meats and other protein foods: Beans. Almonds. Sunflower seeds. Pine nuts. Peanuts. Cod. Salmon. Scallops. Shrimp. Tuna. Tilapia. Clams. Oysters. Eggs.  Dairy: Low-fat milk. Cheese. Greek yogurt.  Beverages: Water. Red wine. Herbal tea.  Fats and oils: Extra virgin olive oil. Avocado oil. Grape seed oil.  Sweets and desserts: Greek yogurt with honey. Baked apples. Poached pears. Trail mix.  Seasoning and other foods: Basil. Cilantro. Coriander. Cumin. Mint. Parsley. Sage. Rosemary. Tarragon. Garlic. Oregano. Thyme. Pepper. Balsalmic vinegar. Tahini. Hummus. Tomato sauce. Olives. Mushrooms. ? Limit these  Grains: Prepackaged pasta or rice dishes. Prepackaged cereal with added sugar.  Vegetables: Deep fried potatoes (french fries).  Fruits: Fruit canned in syrup.  Meats and other protein foods: Beef. Pork. Lamb. Poultry with skin. Hot dogs. Bacon.  Dairy: Ice cream. Sour cream. Whole milk.  Beverages: Juice. Sugar-sweetened soft drinks. Beer. Liquor and spirits.  Fats and oils: Butter. Canola oil. Vegetable oil. Beef fat (tallow). Lard.  Sweets and desserts: Cookies. Cakes. Pies. Candy.  Seasoning and other foods: Mayonnaise. Premade sauces and marinades. The items listed may not be a complete list. Talk with your dietitian about what dietary choices are right for you. Summary  The Mediterranean diet includes both food and lifestyle choices.  Eat a variety of fresh fruits and vegetables, beans, nuts, seeds, and whole grains.  Limit the amount of red meat and sweets that you eat.  Talk with your health care provider about whether it is safe for you to drink red wine in moderation. This means 1 glass a day for nonpregnant women and 2 glasses a day for men. A glass of wine equals 5 oz (150 mL). This information  is not intended to replace advice given to you by your health care provider. Make sure you discuss any questions you have with your health care provider. Document Revised: 03/23/2016 Document Reviewed: 03/16/2016 Elsevier Patient Education  2020 Elsevier Inc.  

## 2020-10-21 NOTE — Progress Notes (Signed)
Male physical   Impression and Recommendations:    1. Healthcare maintenance   2. Essential (primary) hypertension   3. Depression, unspecified depression type   4. Screening for prostate cancer   5. Class 2 severe obesity with serious comorbidity and body mass index (BMI) of 37.0 to 37.9 in adult, unspecified obesity type ([place])   6. Nocturia   7. Mixed hyperlipidemia   8. Anxiety   9. Depression, recurrent ([place])      1) Anticipatory Guidance: Skin CA prevention- recommend to use sunscreen when outside along with skin surveillance; eating a balanced and modest diet; physical activity at least 25 minutes per day or minimum of 150 min/ week moderate to intense activity.  2) Immunizations / Screenings / Labs:   All immunizations are up-to-date per recommendations or will be updated today if pt allows.    - Patient understands with dental and vision screens they will schedule independently.  - Will obtain CBC, CMP, HgA1c, Lipid panel, TSH and PSA when fasting. - UTD Tdap, Hep C screening, Cologuard. Has completed Covid vaccine (Moderna).  3) Weight: Discussed goal to improve diet habits to improve overall feelings of well being and objective health data. Improve nutrient density of diet through increasing intake of fruits and vegetables and decreasing saturated fats, white flour products and refined sugars.  -BMI associated with hypertension and hyperlipidemia. -Patient interested on starting weight loss medication to help in conjunction with starting to make diet changes and increase physical activity.  -No personal or FHx of  MEN or MTC so will start Saxenda. Advised to follow up in 4 weeks.  4) Healthcare Maintenance: -Continue current medication regimen. Provided refills. -Patient agreeable to psychology referral (prefers male therapist). Will place referral. -Provided handout on Mediterranean diet. Advised to start physical activity such as brisk walking 15 minutes  daily. -Will notify of lab results once available.    Orders Placed This Encounter  Procedures   PSA   Comp Met (CMET)   CBC w/Diff   Lipid Profile   HgB A1c   TSH   Ambulatory referral to Psychology    Referral Priority:   Routine    Referral Type:   Psychiatric    Referral Reason:   Specialty Services Required    Requested Specialty:   Psychology    Number of Visits Requested:   1    Meds ordered this encounter  Medications   atorvastatin (LIPITOR) 20 MG tablet    Sig: Take 1 tablet (20 mg total) by mouth daily.    Dispense:  90 tablet    Refill:  1    Order Specific Question:   Supervising Provider    Answer:   Beatrice Lecher D [[id number]]   sertraline (ZOLOFT) 100 MG tablet    Sig: Take 1.5 tablets (150 mg total) by mouth daily.    Dispense:  135 tablet    Refill:  1    Order Specific Question:   Supervising Provider    Answer:   Beatrice Lecher D [[id number]]   tamsulosin (FLOMAX) 0.4 MG CAPS capsule    Sig: Take 1 capsule (0.4 mg total) by mouth daily.    Dispense:  90 capsule    Refill:  1    Order Specific Question:   Supervising Provider    Answer:   Beatrice Lecher D [[id number]]   Liraglutide -Weight Management (SAXENDA) 18 MG/3ML SOPN    Sig: Inject 0.6 mg [place] daily x 7 days. Then inject  1.2 mg [place] daily x 7 days. The inject 1.8 mg [place] daily x 7 days. Then inject 2.4 mg [place] daily x 7 days. Then inject 3 mg [place] once daily.    Dispense:  15 mL    Refill:  1    Order Specific Question:   Supervising Provider    Answer:   Beatrice Lecher D [[id number]]     Return in about 4 weeks (around 11/18/2020) for [facility]- started med.    Gross side effects, risk and benefits, and alternatives of medications discussed with patient.  Patient is aware that all medications have potential side effects and we are unable to predict every side effect or drug-drug interaction that may occur.  Expresses verbal understanding and consents to current therapy plan and treatment  regimen.  Please see AVS handed out to patient at the end of our visit for further patient instructions/ counseling done pertaining to today's office visit.    Subjective:        CC: CPE   HPI: Bryan Ochoa is a 60 y.o. male who presents to [place] [facility] today for a yearly health maintenance exam.     Health Maintenance Summary  - Reviewed and updated, unless pt declines services.  Last Cologuard or Colonoscopy:   07/23/2020- Cologuard (neg) Tobacco History Reviewed:  Y, former smoker. Started smoking at the age of 69 1 PPD and quit a few times for 2-3 years. Officially quit since 2015. Abdominal Ultrasound: N/A  Alcohol / drug use:  No concerns, no use / no use Exercise Habits: No exercise routine Dental Home: N  Eye exams: Y Male history: STD concerns:   none Additional penile/ urinary concerns:    Immunization History  Administered Date(s) Administered   Tdap 03/03/2015     Health Maintenance  Topic Date Due   COVID-19 Vaccine (1) Never done   HIV Screening  Never done   INFLUENZA VACCINE  Never done   TETANUS/TDAP  03/02/2025   COLONOSCOPY (Pts 45-27yr Insurance coverage will need to be confirmed)  01/22/2030   Hepatitis C Screening  Completed   HPV VACCINES  Aged Out       Wt Readings from Last 3 Encounters:  10/21/20 277 lb 3.2 oz (125.7 kg)  03/05/20 (!) 266 lb 14.4 oz (121.1 kg)  12/25/19 264 lb 1.6 oz (119.8 kg)   BP Readings from Last 3 Encounters:  10/21/20 124/80  04/19/20 121/86  03/05/20 122/74   Pulse Readings from Last 3 Encounters:  10/21/20 67  04/19/20 81  03/05/20 69    Patient Active Problem List   Diagnosis Date Noted   Serum calcium elevated 04/10/2019   Restless legs 02/28/2018   Chronic pansinusitis 06/11/2017   Deviated nasal septum 06/11/2017   Hypertrophy of inferior nasal turbinate 06/11/2017   Nasal obstruction 06/11/2017   Nocturia 04/24/2017   Recurrent sinusitis  09/05/2016   Depression 10/26/2015   GERD (gastroesophageal reflux disease) 10/26/2015   Mixed hyperlipidemia 10/26/2015   Obesity 09/29/2014   Primary snoring 09/29/2014   Cerebral infarction due to unspecified mechanism 09/29/2014   Chronic daily headache 06/19/2014   Analgesic rebound headache 06/19/2014   Sleep apnea 06/19/2014   Drug-induced headache, not elsewhere classified, not intractable 06/19/2014   Essential (primary) hypertension 05/12/2014   Low back pain 05/12/2014    Past Medical History:  Diagnosis Date   Chronic daily headache    Depression    Deviated nasal septum  Essential hypertension    GERD (gastroesophageal reflux disease)    Hyperlipidemia    Phreesia 07/21/2020   Hypertension    Phreesia 07/21/2020   Insomnia    Mixed hyperlipidemia    Nocturia    Obesity    RLS (restless legs syndrome)    Sinusitis    Sleep apnea     Past Surgical History:  Procedure Laterality Date   BACK SURGERY     CHOLECYSTECTOMY, LAPAROSCOPIC     NASAL SINUS SURGERY     SEPTOPLASTY     SPINE SURGERY N/A    Phreesia 07/21/2020   WISDOM TOOTH EXTRACTION      Family History  Problem Relation Age of Onset   Diabetes Mother    Heart disease Mother    Hypertension Mother    Cancer Mother    Pancreatic cancer Father    Diabetes Father    Hypertension Father    Heart disease Father    Lung disease Father    Alcohol abuse Father    Crohn's disease Sister    COPD Other        NEPHEW   Prostate cancer Maternal Grandfather     Social History   Substance and Sexual Activity  Drug Use No  ,  Social History   Substance and Sexual Activity  Alcohol Use No   Alcohol/week: 0.0 standard drinks  ,  Social History   Tobacco Use  Smoking Status Former Smoker   Quit date: 06/12/2014   Years since quitting: 6.3  Smokeless Tobacco Never Used  ,  Social History   Substance and Sexual Activity  Sexual Activity  Not Currently    Patient's Medications  New Prescriptions   LIRAGLUTIDE -WEIGHT MANAGEMENT (SAXENDA) 18 MG/3ML SOPN    Inject 0.6 mg [place] daily x 7 days. Then inject 1.2 mg [place] daily x 7 days. The inject 1.8 mg [place] daily x 7 days. Then inject 2.4 mg [place] daily x 7 days. Then inject 3 mg [place] once daily.  Previous Medications   ASPIRIN EC 81 MG TABLET    Take by mouth.   BUPROPION (WELLBUTRIN XL) 150 MG 24 HR TABLET    TAKE 1 TABLET BY MOUTH EVERY DAY IN THE MORNING   CETIRIZINE (ZYRTEC) 10 MG TABLET    Take 10 mg by mouth daily.   DIVALPROEX (DEPAKOTE) 500 MG DR TABLET    TAKE 2 TABLETS BY MOUTH EVERY DAY   ELDERBERRY PO    Take 75 mg by mouth. 2 PO QD   LOSARTAN (COZAAR) 100 MG TABLET    TAKE 1 TABLET (100 MG TOTAL) BY MOUTH DAILY. OFFICE VISIT REQUIRED PRIOR TO ANY FURTHER REFILLS   MULTIPLE VITAMIN (MULTI-VITAMINS) TABS    Take by mouth.   NONFORMULARY OR COMPOUNDED ITEM    [facility]: Achilles tendonitis cream - Diclofenac #, Baclofen 2%, Bupivacaine 1%, Gabapentin 6%, Ibuprofen 3%, Pentoxifylline 3%, apply 1-2 grams to affected area 3-4 times daily.   OMEPRAZOLE (PRILOSEC) 20 MG CAPSULE    Take 1 capsule (20 mg total) by mouth daily.   TIZANIDINE (ZANAFLEX) 2 MG TABLET    Take 1 tablet (2 mg total) by mouth every 8 (eight) hours as needed for muscle spasms.  Modified Medications   Modified Medication Previous Medication   ATORVASTATIN (LIPITOR) 20 MG TABLET atorvastatin (LIPITOR) 20 MG tablet      Take 1 tablet (20 mg total) by mouth daily.    Take 1 tablet (20 mg total) by  mouth daily. **NEEDS APT FOR REFILLS**   SERTRALINE (ZOLOFT) 100 MG TABLET sertraline (ZOLOFT) 100 MG tablet      Take 1.5 tablets (150 mg total) by mouth daily.    Take 1.5 tablets (150 mg total) by mouth daily. **NEEDS APT FOR REFILLS**   TAMSULOSIN (FLOMAX) 0.4 MG CAPS CAPSULE tamsulosin (FLOMAX) 0.4 MG CAPS capsule      Take 1 capsule (0.4 mg total) by mouth daily.    Take 1 capsule (0.4 mg total) by mouth daily.  **PATIENT NEEDS APT FOR MED REFILLS**  Discontinued Medications   BUPROPION (ZYBAN) 150 MG 12 HR TABLET    Take 150 mg by mouth daily.    MELOXICAM (MOBIC) 7.5 MG TABLET    Take 1 tablet (7.5 mg total) by mouth daily.   PRAMIPEXOLE (MIRAPEX) 0.25 MG TABLET    Take 0.25 mg by mouth in the morning, at noon, and at bedtime.   TRAMADOL (ULTRAM) 50 MG TABLET    Take by mouth every 6 (six) hours as needed.    Penicillins  Review of Systems: General:   Denies fever, chills, unexplained weight loss.  Optho/Auditory:   Denies visual changes, blurred vision/LOV Respiratory:   Denies SOB, DOE more than baseline levels.   Cardiovascular:   Denies chest pain, palpitations, new onset peripheral edema  Gastrointestinal:   Denies nausea, vomiting, diarrhea.  Genitourinary: Denies dysuria, freq/ urgency, flank pain Endocrine:     Denies hot or cold intolerance, polyuria, polydipsia. Musculoskeletal:   Denies unexplained myalgias, joint swelling, unexplained arthralgias, gait problems.  Skin:  Denies rash, suspicious lesions Neurological:     Denies dizziness, unexplained weakness, numbness  Psychiatric/Behavioral:   Denies mood changes, suicidal or homicidal ideations, hallucinations    Objective:     Blood pressure 124/80, pulse 67, temperature (!) 96.1 F (35.6 C), height 6' (1.829 m), weight 277 lb 3.2 oz (125.7 kg), SpO2 98 %. Body mass index is 37.6 kg/m. General Appearance:    Alert, cooperative, no distress, appears stated age  Head:    Normocephalic, without obvious abnormality, atraumatic  Eyes:    PERRL, conjunctiva/corneas clear, EOM's intact, both eyes  Ears:    Normal TM's and external ear canals, both ears  Nose:   Nares normal, septum midline, mucosa normal, no drainage    or sinus tenderness  Throat:   Lips w/o lesion, mucosa moist, and tongue normal; teeth and gums normal  Neck:   Supple, symmetrical, trachea midline, no adenopathy;    thyroid:  no  enlargement/tenderness/nodules; no carotid   bruit or JVD  Back:     Symmetric, no curvature, ROM normal, no CVA tenderness  Lungs:     Clear to auscultation bilaterally, respirations unlabored, no Wh/ R/ R  Chest Wall:    No tenderness or gross deformity; normal excursion   Heart:    Regular rate and rhythm, S1 and S2 normal, no murmur, rub   or gallop  Abdomen:     Protuberant, non-tender, bowel sounds active all four quadrants, No G/R/R, no masses, no organomegaly appreciated   Genitalia:   Deferred by pt.  Rectal:   Deferred by pt.  Extremities:   Extremities normal, atraumatic, no cyanosis or gross edema  Pulses:   2+ and symmetric all extremities  Skin:   Warm, dry, Skin color, texture, turgor normal, no obvious rashes or lesions  M-Sk:   Ambulates * 4 w/o difficulty, no gross deformities, tone WNL  Neurologic:   CNII-XII intact, normal strength,  sensation and reflexes    Throughout Psych:  No HI/SI, judgement and insight good, dep. mood, Full Affect.

## 2020-10-27 ENCOUNTER — Telehealth: Payer: Self-pay | Admitting: Physician Assistant

## 2020-10-27 ENCOUNTER — Encounter: Payer: Federal, State, Local not specified - PPO | Admitting: Physician Assistant

## 2020-10-27 NOTE — Telephone Encounter (Signed)
sertraline (ZOLOFT) 100 MG tablet [[id number]]    Order Details Dose: 150 mg Route: Oral Frequency: Daily  Dispense Quantity: 135 tablet Refills: 1        Sig: Take 1.5 tablets (150 mg total) by mouth daily.       Start Date: 10/21/20 End Date: --  Written Date: 10/21/20 Expiration Date: 10/21/21     Diagnosis Association: Depression, unspecified depression type (F32.A)  Original Order:  sertraline (ZOLOFT) 100 MG tablet [[id number]]       Left msg for patient to call back. AS, CMA

## 2020-10-28 ENCOUNTER — Other Ambulatory Visit: Payer: Self-pay | Admitting: Physician Assistant

## 2020-10-28 NOTE — Telephone Encounter (Signed)
Left msg for patient to call back. AS, CMA 

## 2020-10-29 NOTE — Telephone Encounter (Signed)
Attempted to contact patient again. No answer. Left msg to call back. Saving msg to chart. AS, CMA

## 2020-11-03 ENCOUNTER — Other Ambulatory Visit: Payer: Self-pay

## 2020-11-03 ENCOUNTER — Other Ambulatory Visit: Payer: Federal, State, Local not specified - PPO

## 2020-11-03 DIAGNOSIS — B1920 Unspecified viral hepatitis C without hepatic coma: Secondary | ICD-10-CM

## 2020-11-03 DIAGNOSIS — Z125 Encounter for screening for malignant neoplasm of prostate: Secondary | ICD-10-CM | POA: Diagnosis not present

## 2020-11-03 DIAGNOSIS — I10 Essential (primary) hypertension: Secondary | ICD-10-CM

## 2020-11-03 DIAGNOSIS — E782 Mixed hyperlipidemia: Secondary | ICD-10-CM | POA: Diagnosis not present

## 2020-11-03 DIAGNOSIS — R1310 Dysphagia, unspecified: Secondary | ICD-10-CM

## 2020-11-03 DIAGNOSIS — Z0000 Encounter for general adult medical examination without abnormal findings: Secondary | ICD-10-CM

## 2020-11-03 NOTE — Addendum Note (Signed)
Addended by: Sylvester Harder on: 11/03/2020 10:12 AM   Modules accepted: Orders

## 2020-11-03 NOTE — Progress Notes (Signed)
CBC, CMP, HgA1c, Lipid panel, TSH and PSA

## 2020-11-04 LAB — LIPID PANEL
Chol/HDL Ratio: 4.2 ratio (ref 0.0–5.0)
Cholesterol, Total: 151 mg/dL (ref 100–199)
HDL: 36 mg/dL — ABNORMAL LOW (ref >39)
LDL Chol Calc (NIH): 94 mg/dL (ref 0–99)
Triglycerides: 118 mg/dL (ref 0–149)
VLDL Cholesterol Cal: 21 mg/dL (ref 5–40)

## 2020-11-04 LAB — COMPREHENSIVE METABOLIC PANEL
ALT: 55 IU/L — ABNORMAL HIGH (ref 0–44)
AST: 41 IU/L — ABNORMAL HIGH (ref 0–40)
Albumin/Globulin Ratio: 1.5 (ref 1.2–2.2)
Albumin: 4.2 g/dL (ref 3.8–4.9)
Alkaline Phosphatase: 123 IU/L — ABNORMAL HIGH (ref 44–121)
BUN/Creatinine Ratio: 15 (ref 9–20)
BUN: 13 mg/dL (ref 6–24)
Bilirubin Total: 0.3 mg/dL (ref 0.0–1.2)
CO2: 17 mmol/L — ABNORMAL LOW (ref 20–29)
Calcium: 9.7 mg/dL (ref 8.7–10.2)
Chloride: 106 mmol/L (ref 96–106)
Creatinine, Ser: 0.85 mg/dL (ref 0.76–1.27)
Globulin, Total: 2.8 g/dL (ref 1.5–4.5)
Glucose: 107 mg/dL — ABNORMAL HIGH (ref 65–99)
Potassium: 4.5 mmol/L (ref 3.5–5.2)
Sodium: 141 mmol/L (ref 134–144)
Total Protein: 7 g/dL (ref 6.0–8.5)
eGFR: 101 mL/min/{1.73_m2} (ref >59)

## 2020-11-04 LAB — PSA: Prostate Specific Ag, Serum: 0.8 ng/mL (ref 0.0–4.0)

## 2020-11-04 LAB — CBC
Hematocrit: 46.1 % (ref 37.5–51.0)
Hemoglobin: 15.2 g/dL (ref 13.0–17.7)
MCH: 28.5 pg (ref 26.6–33.0)
MCHC: 33 g/dL (ref 31.5–35.7)
MCV: 86 fL (ref 79–97)
Platelets: 241 10*3/uL (ref 150–450)
RBC: 5.34 x10E6/uL (ref 4.14–5.80)
RDW: 14 % (ref 11.6–15.4)
WBC: 5.9 10*3/uL (ref 3.4–10.8)

## 2020-11-04 LAB — TSH: TSH: 3 u[IU]/mL (ref 0.450–4.500)

## 2020-11-04 LAB — HEMOGLOBIN A1C
Est. average glucose Bld gHb Est-mCnc: 126 mg/dL
Hgb A1c MFr Bld: 6 % — ABNORMAL HIGH (ref 4.8–5.6)

## 2020-11-18 ENCOUNTER — Ambulatory Visit: Payer: Federal, State, Local not specified - PPO | Admitting: Physician Assistant

## 2020-11-24 ENCOUNTER — Telehealth: Payer: Self-pay | Admitting: Physician Assistant

## 2020-11-24 NOTE — Telephone Encounter (Signed)
Per Kandis Cocking patient should schedule an apt to discuss this. 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 AS, CMA

## 2020-11-24 NOTE — Telephone Encounter (Signed)
Patient would like a Wednesday appt. I am having trouble getting him scheduled, I tried changing time slots but it still did not work.

## 2020-11-24 NOTE — Telephone Encounter (Signed)
Patient's wife called stating Dr. Trilby Drummer' office wants him to come off his cholesterol medicine so he can take a HEP C medication foe three months. Patient's wife wanted to make sure that is okay with Kandis Cocking and if he needs another cholesterol medication. Please advise, thanks.

## 2020-11-26 ENCOUNTER — Other Ambulatory Visit: Payer: Self-pay | Admitting: Physician Assistant

## 2020-12-01 ENCOUNTER — Ambulatory Visit: Payer: Federal, State, Local not specified - PPO | Admitting: Physician Assistant

## 2020-12-09 ENCOUNTER — Encounter: Payer: Self-pay | Admitting: Physician Assistant

## 2020-12-09 ENCOUNTER — Ambulatory Visit (INDEPENDENT_AMBULATORY_CARE_PROVIDER_SITE_OTHER): Payer: Federal, State, Local not specified - PPO | Admitting: Physician Assistant

## 2020-12-09 DIAGNOSIS — B1920 Unspecified viral hepatitis C without hepatic coma: Secondary | ICD-10-CM

## 2020-12-09 DIAGNOSIS — E782 Mixed hyperlipidemia: Secondary | ICD-10-CM

## 2020-12-09 NOTE — Patient Instructions (Signed)
Hepatitis C  Hepatitis C is a liver infection that is caused by a virus. Many people have no symptoms or only mild symptoms. Hepatitis C is contagious. This means that it can spread from person to person. Over time, the infection can lead to serious liver problems. What are the causes? This condition is caused by a virus. People can get this infection through:  Contact with certain body fluids of someone who has the virus. This includes: ? Blood. ? Semen or vaginal fluids.  Childbirth. A woman can pass the virus to her baby during birth.  Blood or organ donations done in the [place] before 1992. What increases the risk? The following things may make you more likely to get this condition:  Having contact with needles or syringes that have the virus on them. This may happen while: ? Getting a treatment that involves putting thin needles through your skin (acupuncture). ? Getting a tattoo. ? Getting a body piercing. ? Injecting drugs.  Having sex with someone who has the virus. The virus can be passed through vaginal, oral, or anal sex.  Getting treatment to filter your blood (kidney dialysis).  Having HIV or AIDS.  Having a job that involves contact with blood or certain other body fluids, such as in health care. What are the signs or symptoms? Symptoms of this condition include:  Tiredness (fatigue).  Not wanting to eat as much as normal (loss of appetite).  Feeling like you may vomit (nauseous).  Vomiting.  Pain in your belly (abdomen).  Dark yellow pee (urine).  Your skin or the white parts of your eyes turning yellow (jaundice).  Itchy skin.  Light-colored or tan poop (stool).  Joint pain.  Bleeding and bruising that happen often.  Fluid building up in your stomach (ascites). Often, there are no symptoms. How is this treated? Treatment may depend on how bad your condition is, how long it has lasted, and whether you have liver damage. More testing may  be done to figure out the best treatment. Treatment may include:  Taking antiviral medicines and other medicines.  Having follow-up treatments every 6-12 months for infections or other liver problems.  Getting a new liver from a donor (liver transplant). Follow these instructions at home: Medicines  Take over-the-counter and prescription medicines only as told by your doctor.  If you were prescribed an antiviral medicine, take it as told by your doctor. Do not stop using the antiviral even if you start to feel better.  Do not take any new medicines unless your doctor says that this is okay. This includes over-the-counter medicines and supplements. Activity  Rest as needed.  Do not have sex until your doctor says this is okay.  Return to your normal activities as told by your doctor. Ask your doctor what activities are safe for you.  Ask your doctor when you may go back to school or work. Eating and drinking  Eat a balanced diet. Eat plenty of: ? Fruits and vegetables. ? Whole grains. ? Low-fat (lean) meats or non-meat proteins, such as beans or tofu.  Drink enough fluids to keep your pee pale yellow.  Do not drink alcohol.   General instructions  Do not share toothbrushes, nail clippers, or razors.  Wash your hands often with soap and water for at least 20 seconds. If you do not have soap and water, use hand sanitizer.  Cover any cuts or open sores on your skin.  Avoid swimming or using hot tubs if  you have open sores or wounds.  Keep all follow-up visits as told by your doctor. This is important. You may need follow-up visits every 6-12 months. How is this prevented?  Wash your hands often with soap and water for at least 20 seconds.  Do not share needles or syringes.  Use a condom every time you have vaginal, oral, or anal sex. Be sure to use it the right way each time. ? Both females and males should wear condoms. ? Condoms should be kept in place from the  start to the end of sexual activity. ? Latex condoms should be used, if possible.  Do not handle blood or body fluids without gloves or other protection.  Avoid getting tattoos or piercings in places that are not clean. Where to find more information  Centers for Disease Control and Prevention: [employer] Contact a doctor if:  You have a fever.  You have belly pain.  Your pee is dark.  Your poop is a light color or tan.  You have joint pain. Get help right away if:  You feel more and more tired.  You feel more and more weak.  You do not feel like eating.  You cannot eat or drink without vomiting.  Your skin or the whites of your eyes turn yellow or turn more yellow than they were before.  You bruise or bleed easily. Summary  Hepatitis C is a liver infection that is caused by a virus.  Over time, the infection can lead to serious liver problems.  The virus can be spread from person to person through blood, semen, or vaginal fluids.  Do not take any new medicines unless your doctor says that this is okay. This information is not intended to replace advice given to you by your health care provider. Make sure you discuss any questions you have with your health care provider. Document Revised: 04/16/2019 Document Reviewed: 03/24/2019 Elsevier Patient Education  2021 [employer].

## 2020-12-09 NOTE — Progress Notes (Signed)
Telehealth office visit note for Bryan Masker, PA-C- at Primary Care at [facility]   I connected with current patient today by telephone and verified that I am speaking with the correct person   . Location of the patient: Home . Location of the provider: Office - This visit type was conducted due to national recommendations for restrictions regarding the COVID-19 Pandemic (e.g. social distancing) in an effort to limit this patient's exposure and mitigate transmission in our community.    - No physical exam could be performed with this format, beyond that communicated to [place] by the patient/ family members as noted.   - Additionally my office staff/ schedulers were to discuss with the patient that there may be a monetary charge related to this service, depending on their medical insurance.  My understanding is that patient understood and consented to proceed.     _________________________________________________________________________________   History of Present Illness: Patient calls in to discuss discontinuing cholesterol medication to start treatment for Hepatitis C. Patient reports has to stop medication 8 weeks before starting treatment. Patient tested positive for Hep C antibody and had additional work up consistent with infection.      GAD 7 : Generalized Anxiety Score 03/05/2020 12/25/2019  Nervous, Anxious, on Edge 2 1  Control/stop worrying 0 0  Worry too much - different things 0 0  Trouble relaxing 2 3  Restless 3 2  Easily annoyed or irritable 3 3  Afraid - awful might happen 0 0  Total GAD 7 Score 10 9  Anxiety Difficulty Somewhat difficult Not difficult at all    Depression screen [facility] 2/9 12/09/2020 10/21/2020 03/05/2020 12/25/2019  Decreased Interest 3 3 2  0  Down, Depressed, Hopeless 0 0 0 0  PHQ - 2 Score 3 3 2  0  Altered sleeping 0 0 1 1  Tired, decreased energy 2 3 2 3   Change in appetite 3 3 2 1   Feeling bad or failure about yourself  0 0 0 0  Trouble  concentrating 1 0 0 0  Moving slowly or fidgety/restless 0 0 0 3  Suicidal thoughts 0 0 0 0  PHQ-9 Score 9 9 7 8   Difficult doing work/chores - - Not difficult at all Not difficult at all      Impression and Recommendations:     1. Hepatitis C virus infection without hepatic coma, unspecified chronicity   2. Mixed hyperlipidemia     Hepatitis C virus infection without hepatic coma, unspecified chronicity: -Continue to follow up with Hepatitis C specialist/Gastroenterology- Dr. .  -In agreement to discontinue statin therapy to start Hep C treatment.  Mixed Hyperlipidemia: -Discussed with patient ok to temporarily discontinue atorvastatin 20 mg until completes treatment for Hepatitis C and cleared from Hep C specialist.  -Recommend to closely monitor diet- simple carbohydrates, saturated and trans fats. Patient verbalized understanding. -Will continue to monitor.    - As part of my medical decision making, I reviewed the following data within the electronic MEDICAL RECORD NUMBER History obtained from pt /family, CMA notes reviewed and incorporated if applicable, Labs reviewed, Radiograph/ tests reviewed if applicable and OV notes from prior OV's with me, as well as any other specialists she/he has seen since seeing me last, were all reviewed and used in my medical decision making process today.    - Additionally, when appropriate, discussion had with patient regarding our treatment plan, and their biases/concerns about that plan were used in my medical decision making  today.    - The patient agreed with the plan and demonstrated an understanding of the instructions.   No barriers to understanding were identified.     - The patient was advised to call back or seek an in-person evaluation if the symptoms worsen or if the condition fails to improve as anticipated.   Return in about 4 months (around 04/11/2021) for HLD, Mood.    No orders of the defined types were placed in this  encounter.   No orders of the defined types were placed in this encounter.   Medications Discontinued During This Encounter  Medication Reason  . Liraglutide -Weight Management (SAXENDA) 18 MG/3ML SOPN Patient Preference  . NONFORMULARY OR COMPOUNDED ITEM Patient Preference       Time spent on telephone encounter was 5 minutes.      The 21st Century Cures Act was signed into law in 2016 which includes the topic of electronic health records.  This provides immediate access to information in MyChart.  This includes consultation notes, operative notes, office notes, lab results and pathology reports.  If you have any questions about what you read please let us know at your next visit or call us at the office.  We are right here with you.   __________________________________________________________________________________     Patient Care Team    Relationship Specialty Notifications Start End  Bryan Ochoa, [place] PCP - General Physician Assistant  12/25/19   Charna Elizabeth, MD Consulting Physician Gastroenterology  12/25/19   Gerrie Nordmann, PA  Physician Assistant  12/25/19   Julio Sicks, MD Consulting Physician Neurosurgery  12/25/19   [facility], [facility]  Podiatry  12/25/19      -Vitals obtained; medications/ allergies reconciled;  personal medical, social, Sx etc.histories were updated by CMA, reviewed by me and are reflected in chart   Patient Active Problem List   Diagnosis Date Noted  . Serum calcium elevated 04/10/2019  . Restless legs 02/28/2018  . Chronic pansinusitis 06/11/2017  . Deviated nasal septum 06/11/2017  . Hypertrophy of inferior nasal turbinate 06/11/2017  . Nasal obstruction 06/11/2017  . Nocturia 04/24/2017  . Recurrent sinusitis 09/05/2016  . Depression 10/26/2015  . GERD (gastroesophageal reflux disease) 10/26/2015  . Mixed hyperlipidemia 10/26/2015  . Obesity 09/29/2014  . Primary snoring 09/29/2014  . Cerebral  infarction due to unspecified mechanism 09/29/2014  . Chronic daily headache 06/19/2014  . Analgesic rebound headache 06/19/2014  . Sleep apnea 06/19/2014  . Drug-induced headache, not elsewhere classified, not intractable 06/19/2014  . Essential (primary) hypertension 05/12/2014  . Low back pain 05/12/2014     Current Meds  Medication Sig  . aspirin EC 81 MG tablet Take by mouth.  Marland Kitchen atorvastatin (LIPITOR) 20 MG tablet Take 1 tablet (20 mg total) by mouth daily.  Marland Kitchen buPROPion (WELLBUTRIN XL) 150 MG 24 hr tablet TAKE 1 TABLET BY MOUTH EVERY DAY IN THE MORNING  . cetirizine (ZYRTEC) 10 MG tablet Take 10 mg by mouth daily.  . divalproex (DEPAKOTE) 500 MG DR tablet TAKE 2 TABLETS BY MOUTH EVERY DAY  . ELDERBERRY PO Take 75 mg by mouth. 2 PO QD  . losartan (COZAAR) 100 MG tablet TAKE 1 TABLET (100 MG TOTAL) BY MOUTH DAILY. OFFICE VISIT REQUIRED PRIOR TO ANY FURTHER REFILLS  . Multiple Vitamin (MULTI-VITAMINS) TABS Take by mouth.  Marland Kitchen omeprazole (PRILOSEC) 20 MG capsule Take 1 capsule (20 mg total) by mouth daily.  . sertraline (ZOLOFT) 100 MG tablet Take 1.5 tablets (  150 mg total) by mouth daily.  . tamsulosin (FLOMAX) 0.4 MG CAPS capsule Take 1 capsule (0.4 mg total) by mouth daily.  Marland Kitchen tiZANidine (ZANAFLEX) 2 MG tablet Take 1 tablet (2 mg total) by mouth every 8 (eight) hours as needed for muscle spasms.     Allergies:  Allergies  Allergen Reactions  . Penicillins Other (See Comments)    Knots all over body. Other reaction(s): Other (See Comments) Knots all over body. Knots all over body.     ROS:  See above HPI for pertinent positives and negatives   Objective:   There were no vitals taken for this visit.  (if some vitals are omitted, this means that patient was UNABLE to obtain them.) General: A & O * 3; sounds in no acute distress Respiratory: speaking in full sentences, no conversational dyspnea Psych: insight appears good, mood- appears full

## 2020-12-25 ENCOUNTER — Other Ambulatory Visit: Payer: Self-pay | Admitting: Physician Assistant

## 2020-12-31 ENCOUNTER — Telehealth: Payer: Self-pay | Admitting: Physician Assistant

## 2020-12-31 ENCOUNTER — Other Ambulatory Visit: Payer: Self-pay | Admitting: Physician Assistant

## 2020-12-31 DIAGNOSIS — K219 Gastro-esophageal reflux disease without esophagitis: Secondary | ICD-10-CM

## 2020-12-31 NOTE — Telephone Encounter (Signed)
This has been done. Ok per [employer]. AS, CMA

## 2020-12-31 NOTE — Telephone Encounter (Signed)
Dr Kenna Gilbert office needs a letter from [place] stating she is aware he is off the cholesterol medicine and she has spoken with him. Patient has not taken it since April 17 th. When it is ready it needs to be faxed over. [phone] is the fax number and attention donna. Thanks

## 2021-01-29 ENCOUNTER — Other Ambulatory Visit: Payer: Self-pay | Admitting: Physician Assistant

## 2021-02-10 DIAGNOSIS — R768 Other specified abnormal immunological findings in serum: Secondary | ICD-10-CM | POA: Diagnosis not present

## 2021-02-23 ENCOUNTER — Other Ambulatory Visit: Payer: Self-pay | Admitting: Physician Assistant

## 2021-03-08 ENCOUNTER — Other Ambulatory Visit: Payer: Self-pay | Admitting: Physician Assistant

## 2021-03-24 ENCOUNTER — Ambulatory Visit: Payer: Federal, State, Local not specified - PPO | Admitting: Podiatry

## 2021-04-04 ENCOUNTER — Other Ambulatory Visit: Payer: Self-pay | Admitting: Physician Assistant

## 2021-04-04 DIAGNOSIS — K219 Gastro-esophageal reflux disease without esophagitis: Secondary | ICD-10-CM

## 2021-04-14 ENCOUNTER — Ambulatory Visit: Payer: Federal, State, Local not specified - PPO | Admitting: Physician Assistant

## 2021-04-28 ENCOUNTER — Other Ambulatory Visit: Payer: Self-pay | Admitting: Physician Assistant

## 2021-04-29 ENCOUNTER — Other Ambulatory Visit: Payer: Self-pay | Admitting: Physician Assistant

## 2021-04-29 DIAGNOSIS — R351 Nocturia: Secondary | ICD-10-CM

## 2021-04-29 DIAGNOSIS — E782 Mixed hyperlipidemia: Secondary | ICD-10-CM

## 2021-05-11 ENCOUNTER — Ambulatory Visit (INDEPENDENT_AMBULATORY_CARE_PROVIDER_SITE_OTHER): Payer: Federal, State, Local not specified - PPO | Admitting: Physician Assistant

## 2021-05-11 ENCOUNTER — Encounter: Payer: Self-pay | Admitting: Physician Assistant

## 2021-05-11 ENCOUNTER — Other Ambulatory Visit: Payer: Self-pay

## 2021-05-11 VITALS — BP 138/90 | HR 69 | Temp 98.3°F | Ht 72.0 in | Wt 258.2 lb

## 2021-05-11 DIAGNOSIS — E782 Mixed hyperlipidemia: Secondary | ICD-10-CM

## 2021-05-11 DIAGNOSIS — F339 Major depressive disorder, recurrent, unspecified: Secondary | ICD-10-CM

## 2021-05-11 DIAGNOSIS — B1920 Unspecified viral hepatitis C without hepatic coma: Secondary | ICD-10-CM

## 2021-05-11 DIAGNOSIS — I10 Essential (primary) hypertension: Secondary | ICD-10-CM | POA: Diagnosis not present

## 2021-05-11 DIAGNOSIS — G43709 Chronic migraine without aura, not intractable, without status migrainosus: Secondary | ICD-10-CM

## 2021-05-11 DIAGNOSIS — M542 Cervicalgia: Secondary | ICD-10-CM

## 2021-05-11 DIAGNOSIS — M545 Low back pain, unspecified: Secondary | ICD-10-CM

## 2021-05-11 DIAGNOSIS — Z6835 Body mass index (BMI) 35.0-35.9, adult: Secondary | ICD-10-CM

## 2021-05-11 DIAGNOSIS — R7303 Prediabetes: Secondary | ICD-10-CM

## 2021-05-11 MED ORDER — BUPROPION HCL ER (XL) 300 MG PO TB24: 300.0000 mg | ORAL_TABLET | Freq: Every day | ORAL | 0 refills | Status: DC

## 2021-05-11 NOTE — Progress Notes (Signed)
Established Patient Office Visit  Subjective:  Patient ID: Bryan Ochoa, male    DOB: 1961-08-04  Age: 60 y.o. MRN: [id number]  CC:  Chief Complaint  Patient presents with   Hyperlipidemia    HPI Bryan Ochoa presents for follow up on hypertension, hyperlipidemia and mood management. Patient reports fell from his ceiling 3 weeks ago, landed on his back and his neck went back and forth. Has c/o of low back and neck pain. Denies fever, chills, numbness or tingling sensation of UE or LE, bowel or bladder dysfunction. Patient states continues to have daily headaches, and if has a severe migraine has to call out of work.   HTN: Pt denies chest pain, palpitations, dizziness or lower extremity swelling. Taking medication as directed without side effects. Does not check BP at home. Pt trying to monitor sodium intake.  HLD: Pt currently managing with diet and lifestyle. Atorvastatin was stopped so patient could start treatment for Hep C. States has not been diligent with a low fat but has changed to day shift and is not eating as late as before which has also helped with weight loss.  Mood: States has noticed a temper and can easily go off on people especially when on the road. Taking medications as directed without issues, however, does report occasionally misses a dose of Wellbutrin.  Past Medical History:  Diagnosis Date   Chronic daily headache    Depression    Deviated nasal septum    Essential hypertension    GERD (gastroesophageal reflux disease)    Hyperlipidemia    Phreesia 07/21/2020   Hypertension    Phreesia 07/21/2020   Insomnia    Mixed hyperlipidemia    Nocturia    Obesity    RLS (restless legs syndrome)    Sinusitis    Sleep apnea     Past Surgical History:  Procedure Laterality Date   BACK SURGERY     CHOLECYSTECTOMY, LAPAROSCOPIC     NASAL SINUS SURGERY     SEPTOPLASTY     SPINE SURGERY N/A    Phreesia 07/21/2020   WISDOM TOOTH EXTRACTION      Family  History  Problem Relation Age of Onset   Diabetes Mother    Heart disease Mother    Hypertension Mother    Cancer Mother    Pancreatic cancer Father    Diabetes Father    Hypertension Father    Heart disease Father    Lung disease Father    Alcohol abuse Father    Crohn's disease Sister    COPD Other        NEPHEW   Prostate cancer Maternal Grandfather     Social History   Socioeconomic History   Marital status: Married    Spouse name: Not on file   Number of children: 2   Years of education: ASSOCIATES   Highest education level: Not on file  Occupational History   Not on file  Tobacco Use   Smoking status: Former    Types: Cigarettes    Quit date: 06/12/2014    Years since quitting: 6.9   Smokeless tobacco: Never  Vaping Use   Vaping Use: Never used  Substance and Sexual Activity   Alcohol use: No    Alcohol/week: 0.0 standard drinks   Drug use: No   Sexual activity: Not Currently  Other Topics Concern   Not on file  Social History Narrative   Patient is married with 2 children.  Patient is right handed.   Patient has his Associates degree.   Patient drinks 2 cups daily.   Social Determinants of Health   Financial Resource Strain: Not on file  Food Insecurity: Not on file  Transportation Needs: Not on file  Physical Activity: Not on file  Stress: Not on file  Social Connections: Not on file  Intimate Partner Violence: Not on file    Outpatient Medications Prior to Visit  Medication Sig Dispense Refill   aspirin EC 81 MG tablet Take by mouth.     atorvastatin (LIPITOR) 20 MG tablet TAKE 1 TABLET BY MOUTH EVERY DAY 90 tablet 0   cetirizine (ZYRTEC) 10 MG tablet Take 10 mg by mouth daily.     divalproex (DEPAKOTE) 500 MG DR tablet TAKE 2 TABLETS BY MOUTH EVERY DAY 120 tablet 0   ELDERBERRY PO Take 75 mg by mouth. 2 PO QD     losartan (COZAAR) 100 MG tablet TAKE 1 TABLET BY MOUTH EVERY DAY 90 tablet 0   MAVYRET 100-40 MG TABS Take 3 tablets by mouth  daily.     Multiple Vitamin (MULTI-VITAMINS) TABS Take by mouth.     omeprazole (PRILOSEC) 20 MG capsule TAKE 1 CAPSULE BY MOUTH EVERY DAY 90 capsule 0   sertraline (ZOLOFT) 100 MG tablet Take 1.5 tablets (150 mg total) by mouth daily. 135 tablet 1   tamsulosin (FLOMAX) 0.4 MG CAPS capsule TAKE 1 CAPSULE BY MOUTH EVERY DAY 90 capsule 0   tiZANidine (ZANAFLEX) 2 MG tablet Take 1 tablet (2 mg total) by mouth every 8 (eight) hours as needed for muscle spasms. 15 tablet 0   buPROPion (WELLBUTRIN XL) 150 MG 24 hr tablet Take 1 tablet (150 mg total) by mouth daily. **PLEASE CONTACT OUR OFFICE TO SCHEDULE A FOLLOW UP FOR FUTURE MED REFILLS** 30 tablet 0   No facility-administered medications prior to visit.    Allergies  Allergen Reactions   Penicillins Other (See Comments)    Knots all over body. Other reaction(s): Other (See Comments) Knots all over body. Knots all over body.    ROS Review of Systems Review of Systems:  A fourteen system review of systems was performed and found to be positive as per HPI.   Objective:    Physical Exam General:  Well Developed, well nourished, appropriate for stated age.  Neuro:  Alert and oriented,  extra-ocular muscles intact  HEENT:  Normocephalic, atraumatic, neck supple Skin:  no gross rash, warm, pink. Cardiac:  RRR, S1 S2 Respiratory: CTA B/L, Not using accessory muscles, speaking in full sentences- unlabored. MSK: Tenderness of trapezoid area and midline of lumbar spine, no step off or deformity noted, overall good cervical and spinal ROM with some discomfort with cervical rotational movements. Vascular:  Ext warm, no cyanosis apprec.; cap RF less 2 sec. Psych:  No HI/SI, judgement and insight good, Euthymic mood. Full Affect.  BP 138/90   Pulse 69   Temp 98.3 F (36.8 C)   Ht 6' (1.829 m)   Wt 258 lb 3.2 oz (117.1 kg)   SpO2 98%   BMI 35.02 kg/m  Wt Readings from Last 3 Encounters:  05/11/21 258 lb 3.2 oz (117.1 kg)  10/21/20 277  lb 3.2 oz (125.7 kg)  03/05/20 (!) 266 lb 14.4 oz (121.1 kg)     Health Maintenance Due  Topic Date Due   HIV Screening  Never done   Zoster Vaccines- Shingrix (1 of 2) Never done   COVID-19 Vaccine (3 -   Booster for Moderna series) 02/07/2021   INFLUENZA VACCINE  Never done    There are no preventive care reminders to display for this patient.  Lab Results  Component Value Date   TSH 3.000 11/03/2020   Lab Results  Component Value Date   WBC 7.4 05/11/2021   HGB 15.7 05/11/2021   HCT 45.8 05/11/2021   MCV 84 05/11/2021   PLT 265 05/11/2021   Lab Results  Component Value Date   NA 138 05/11/2021   K 4.3 05/11/2021   CO2 20 05/11/2021   GLUCOSE 91 05/11/2021   BUN 14 05/11/2021   CREATININE 0.81 05/11/2021   BILITOT 0.3 05/11/2021   ALKPHOS 109 05/11/2021   AST 18 05/11/2021   ALT 14 05/11/2021   PROT 7.3 05/11/2021   ALBUMIN 4.6 05/11/2021   CALCIUM 9.4 05/11/2021   EGFR 102 05/11/2021   Lab Results  Component Value Date   CHOL 218 (H) 05/11/2021   Lab Results  Component Value Date   HDL 35 (L) 05/11/2021   Lab Results  Component Value Date   LDLCALC 154 (H) 05/11/2021   Lab Results  Component Value Date   TRIG 160 (H) 05/11/2021   Lab Results  Component Value Date   CHOLHDL 6.2 (H) 05/11/2021   Lab Results  Component Value Date   HGBA1C 5.8 (H) 05/11/2021   Depression screen PHQ 2/9 05/11/2021 12/09/2020 10/21/2020 03/05/2020 12/25/2019  Decreased Interest _0 0  Down, Depressed, Hopeless 1 0 0 0 0  PHQ - 2 Score _1 0  Altered sleeping 0 0 0 1 1  Tired, decreased energy _2 Change in appetite _3 Feeling bad or failure about yourself  1 0 0 0 0  Trouble concentrating 1 1 0 0 0  Moving slowly or fidgety/restless 0 0 0 0 3  Suicidal thoughts 0 0 0 0 0  PHQ-9 Score _4 Difficult doing work/chores - - - Not difficult at all Not difficult at all   GAD 7 : Generalized Anxiety Score 05/11/2021 03/05/2020 12/25/2019   Nervous, Anxious, on Edge _5 Control/stop worrying 2 0 0  Worry too much - different things 1 0 0  Trouble relaxing _6 Restless _7 Easily annoyed or irritable _8 Afraid - awful might happen 0 0 0  Total GAD 7 Score _9 Anxiety Difficulty - Somewhat difficult Not difficult at all        Assessment & Plan:   Problem List Items Addressed This Visit       Cardiovascular and Mediastinum   Essential (primary) hypertension   Relevant Orders   Comp Met (CMET) (Completed)   CBC w/Diff (Completed)     Other   Low back pain   Relevant Orders   DG Lumbar Spine Complete   Mixed hyperlipidemia - Primary   Relevant Orders   Lipid Profile (Completed)   Other Visit Diagnoses     Depression, recurrent ([place])       Relevant Medications   buPROPion (WELLBUTRIN XL) 300 MG 24 hr tablet   Hepatitis C virus infection without hepatic coma, unspecified chronicity       Prediabetes       Relevant Orders   HgB A1c (Completed)   Neck pain       Relevant Orders   DG Cervical  Spine Complete   Severe obesity (BMI 35.0-35.9 with comorbidity) (HCC)       Migraine without status migrainosus, not intractable, unspecified migraine type       Relevant Medications   buPROPion (WELLBUTRIN XL) 300 MG 24 hr tablet      Essential hypertension: -BP mildly elevated, BP recheck did not improve.  Recommend to monitor BP/pulse at home and keep a log to bring at next office visit.  Will reassess BP at follow-up. -Will continue current medication regimen.   -Will collect CMP for medication monitoring.  Mixed hyperlipidemia: -Last lipid panel: Total cholesterol 151, triglycerides 118, HDL 36, LDL 94 -Will repeat lipid panel.  Statin therapy on hold, patient on treatment for hepatitis C infection.  Discussed with patient resuming statin therapy (recommend changing to Pravastatin) once has completed Hep C treatment. Patient verbalized understanding. -Discussed low-fat diet.  Recurrent  depression: -Discussed with patient treatment adjustments and agreeable to increased Wellbutrin to 300 mg. Will continue sertraline 100 mg. Advised to let me know if unable to tolerate increased dose. -Will continue to monitor and reassess medication therapy at follow up visit.  Prediabetes: -Last A1c 6.0, will repeat today. -Recommend to follow a low carbohydrate and glucose diet.  Neck pain, Low back pain: -Will place imaging studies for cervical and lumbar x-rays for further evaluation. Recommend conservative therapy with heat/ice therapy and gentle stretches/exercises. If symptoms fail to improve or worsen recommend referral to Orthopedics.   Hep C virus infection without hepatic coma, unspecified chronicity: -Followed by gastroenterology. -On Mavret.  Chronic migraine without aura, without status migrainosus, not intractable: -Uncontrolled. Discussed with patient treatment adjustments and recommend cautious use of increasing Depakote to 500 mg BID with closely monitoring hepatic function. Patient will consider abortive therapy with Nurtec, will further discuss and reassess at follow up visit.  Severe obesity (BMI 35.0-35.9 with comorbidity):  -Associated with hypertension and hyperlipidemia. -Praised patient for weight loss of 19 pounds since last visit. -Encourage to continue weight loss efforts with dietary and lifestyle changes.  Meds ordered this encounter  Medications   buPROPion (WELLBUTRIN XL) 300 MG 24 hr tablet    Sig: Take 1 tablet (300 mg total) by mouth daily.    Dispense:  90 tablet    Refill:  0    Order Specific Question:   Supervising Provider    Answer:   METHENEY, CATHERINE D [[id number]]     Follow-up: Return in about 3 months (around 08/11/2021) for HTN, HLD, mood- inc med.    Maritza Abonza, PA-C 

## 2021-05-11 NOTE — Patient Instructions (Signed)
[facility] [address] [place], [place]    DASH Eating Plan DASH stands for Dietary Approaches to Stop Hypertension. The DASH eating plan is a healthy eating plan that has been shown to: Reduce high blood pressure (hypertension). Reduce your risk for type 2 diabetes, heart disease, and stroke. Help with weight loss. What are tips for following this plan? Reading food labels Check food labels for the amount of salt (sodium) per serving. Choose foods with less than 5 percent of the Daily Value of sodium. Generally, foods with less than 300 milligrams (mg) of sodium per serving fit into this eating plan. To find whole grains, look for the word "whole" as the first word in the ingredient list. Shopping Buy products labeled as "low-sodium" or "no salt added." Buy fresh foods. Avoid canned foods and pre-made or frozen meals. Cooking Avoid adding salt when cooking. Use salt-free seasonings or herbs instead of table salt or sea salt. Check with your health care provider or pharmacist before using salt substitutes. Do not fry foods. Cook foods using healthy methods such as baking, boiling, grilling, roasting, and broiling instead. Cook with heart-healthy oils, such as olive, canola, avocado, soybean, or sunflower oil. Meal planning  Eat a balanced diet that includes: 4 or more servings of fruits and 4 or more servings of vegetables each day. Try to fill one-half of your plate with fruits and vegetables. 6-8 servings of whole grains each day. Less than 6 oz (170 g) of lean meat, poultry, or fish each day. A 3-oz (85-g) serving of meat is about the same size as a deck of cards. One egg equals 1 oz (28 g). 2-3 servings of low-fat dairy each day. One serving is 1 cup (237 mL). 1 serving of nuts, seeds, or beans 5 times each week. 2-3 servings of heart-healthy fats. Healthy fats called omega-3 fatty acids are found in foods such as walnuts, flaxseeds, fortified milks, and eggs.  These fats are also found in cold-water fish, such as sardines, salmon, and mackerel. Limit how much you eat of: Canned or prepackaged foods. Food that is high in trans fat, such as some fried foods. Food that is high in saturated fat, such as fatty meat. Desserts and other sweets, sugary drinks, and other foods with added sugar. Full-fat dairy products. Do not salt foods before eating. Do not eat more than 4 egg yolks a week. Try to eat at least 2 vegetarian meals a week. Eat more home-cooked food and less restaurant, buffet, and fast food. Lifestyle When eating at a restaurant, ask that your food be prepared with less salt or no salt, if possible. If you drink alcohol: Limit how much you use to: 0-1 drink a day for women who are not pregnant. 0-2 drinks a day for men. Be aware of how much alcohol is in your drink. In the [place] one drink equals one 12 oz bottle of beer (355 mL), one 5 oz glass of wine (148 mL), or one 1 oz glass of hard liquor (44 mL). General information Avoid eating more than 2,300 mg of salt a day. If you have hypertension, you may need to reduce your sodium intake to 1,500 mg a day. Work with your health care provider to maintain a healthy body weight or to lose weight. Ask what an ideal weight is for you. Get at least 30 minutes of exercise that causes your heart to beat faster (aerobic exercise) most days of the week. Activities may include walking, swimming,  or biking. Work with your health care provider or dietitian to adjust your eating plan to your individual calorie needs. What foods should I eat? Fruits All fresh, dried, or frozen fruit. Canned fruit in natural juice (without added sugar). Vegetables Fresh or frozen vegetables (raw, steamed, roasted, or grilled). Low-sodium or reduced-sodium tomato and vegetable juice. Low-sodium or reduced-sodium tomato sauce and tomato paste. Low-sodium or reduced-sodium canned vegetables. Grains Whole-grain or  whole-wheat bread. Whole-grain or whole-wheat pasta. Brown rice. Orpah Cobb. Bulgur. Whole-grain and low-sodium cereals. Pita bread. Low-fat, low-sodium crackers. Whole-wheat flour tortillas. Meats and other proteins Skinless chicken or [place]. Ground chicken or [place]. Pork with fat trimmed off. Fish and seafood. Egg whites. Dried beans, peas, or lentils. Unsalted nuts, nut butters, and seeds. Unsalted canned beans. Lean cuts of beef with fat trimmed off. Low-sodium, lean precooked or cured meat, such as sausages or meat loaves. Dairy Low-fat (1%) or fat-free (skim) milk. Reduced-fat, low-fat, or fat-free cheeses. Nonfat, low-sodium ricotta or cottage cheese. Low-fat or nonfat yogurt. Low-fat, low-sodium cheese. Fats and oils Soft margarine without trans fats. Vegetable oil. Reduced-fat, low-fat, or light mayonnaise and salad dressings (reduced-sodium). Canola, safflower, olive, avocado, soybean, and sunflower oils. Avocado. Seasonings and condiments Herbs. Spices. Seasoning mixes without salt. Other foods Unsalted popcorn and pretzels. Fat-free sweets. The items listed above may not be a complete list of foods and beverages you can eat. Contact a dietitian for more information. What foods should I avoid? Fruits Canned fruit in a light or heavy syrup. Fried fruit. Fruit in cream or butter sauce. Vegetables Creamed or fried vegetables. Vegetables in a cheese sauce. Regular canned vegetables (not low-sodium or reduced-sodium). Regular canned tomato sauce and paste (not low-sodium or reduced-sodium). Regular tomato and vegetable juice (not low-sodium or reduced-sodium). Rosita Fire. Olives. Grains Baked goods made with fat, such as croissants, muffins, or some breads. Dry pasta or rice meal packs. Meats and other proteins Fatty cuts of meat. Ribs. Fried meat. Tomasa Blase. Bologna, salami, and other precooked or cured meats, such as sausages or meat loaves. Fat from the back of a pig (fatback).  Bratwurst. Salted nuts and seeds. Canned beans with added salt. Canned or smoked fish. Whole eggs or egg yolks. Chicken or [place] with skin. Dairy Whole or 2% milk, cream, and half-and-half. Whole or full-fat cream cheese. Whole-fat or sweetened yogurt. Full-fat cheese. Nondairy creamers. Whipped toppings. Processed cheese and cheese spreads. Fats and oils Butter. Stick margarine. Lard. Shortening. Ghee. Bacon fat. Tropical oils, such as coconut, palm kernel, or palm oil. Seasonings and condiments Onion salt, garlic salt, seasoned salt, table salt, and sea salt. Worcestershire sauce. Tartar sauce. Barbecue sauce. Teriyaki sauce. Soy sauce, including reduced-sodium. Steak sauce. Canned and packaged gravies. Fish sauce. Oyster sauce. Cocktail sauce. Store-bought horseradish. Ketchup. Mustard. Meat flavorings and tenderizers. Bouillon cubes. Hot sauces. Pre-made or packaged marinades. Pre-made or packaged taco seasonings. Relishes. Regular salad dressings. Other foods Salted popcorn and pretzels. The items listed above may not be a complete list of foods and beverages you should avoid. Contact a dietitian for more information. Where to find more information National Heart, Lung, and Blood Institute: PopSteam.is American Heart Association: www.heart.org Academy of Nutrition and Dietetics: www.eatright.org National Kidney Foundation: www.kidney.org Summary The DASH eating plan is a healthy eating plan that has been shown to reduce high blood pressure (hypertension). It may also reduce your risk for type 2 diabetes, heart disease, and stroke. When on the DASH eating plan, aim to eat more fresh fruits and vegetables, whole  grains, lean proteins, low-fat dairy, and heart-healthy fats. With the DASH eating plan, you should limit salt (sodium) intake to 2,300 mg a day. If you have hypertension, you may need to reduce your sodium intake to 1,500 mg a day. Work with your health care provider or  dietitian to adjust your eating plan to your individual calorie needs. This information is not intended to replace advice given to you by your health care provider. Make sure you discuss any questions you have with your health care provider. Document Revised: 06/27/2019 Document Reviewed: 06/27/2019 Elsevier Patient Education  2022 [employer].

## 2021-05-12 LAB — CBC WITH DIFFERENTIAL/PLATELET
Basophils Absolute: 0.1 10*3/uL (ref 0.0–0.2)
Basos: 2 %
EOS (ABSOLUTE): 0.2 10*3/uL (ref 0.0–0.4)
Eos: 3 %
Hematocrit: 45.8 % (ref 37.5–51.0)
Hemoglobin: 15.7 g/dL (ref 13.0–17.7)
Immature Grans (Abs): 0 10*3/uL (ref 0.0–0.1)
Immature Granulocytes: 0 %
Lymphocytes Absolute: 3 10*3/uL (ref 0.7–3.1)
Lymphs: 40 %
MCH: 28.8 pg (ref 26.6–33.0)
MCHC: 34.3 g/dL (ref 31.5–35.7)
MCV: 84 fL (ref 79–97)
Monocytes Absolute: 0.7 10*3/uL (ref 0.1–0.9)
Monocytes: 10 %
Neutrophils Absolute: 3.4 10*3/uL (ref 1.4–7.0)
Neutrophils: 45 %
Platelets: 265 10*3/uL (ref 150–450)
RBC: 5.46 x10E6/uL (ref 4.14–5.80)
RDW: 14.1 % (ref 11.6–15.4)
WBC: 7.4 10*3/uL (ref 3.4–10.8)

## 2021-05-12 LAB — COMPREHENSIVE METABOLIC PANEL
ALT: 14 IU/L (ref 0–44)
AST: 18 IU/L (ref 0–40)
Albumin/Globulin Ratio: 1.7 (ref 1.2–2.2)
Albumin: 4.6 g/dL (ref 3.8–4.9)
Alkaline Phosphatase: 109 IU/L (ref 44–121)
BUN/Creatinine Ratio: 17 (ref 9–20)
BUN: 14 mg/dL (ref 6–24)
Bilirubin Total: 0.3 mg/dL (ref 0.0–1.2)
CO2: 20 mmol/L (ref 20–29)
Calcium: 9.4 mg/dL (ref 8.7–10.2)
Chloride: 104 mmol/L (ref 96–106)
Creatinine, Ser: 0.81 mg/dL (ref 0.76–1.27)
Globulin, Total: 2.7 g/dL (ref 1.5–4.5)
Glucose: 91 mg/dL (ref 70–99)
Potassium: 4.3 mmol/L (ref 3.5–5.2)
Sodium: 138 mmol/L (ref 134–144)
Total Protein: 7.3 g/dL (ref 6.0–8.5)
eGFR: 102 mL/min/{1.73_m2} (ref >59)

## 2021-05-12 LAB — LIPID PANEL
Chol/HDL Ratio: 6.2 ratio — ABNORMAL HIGH (ref 0.0–5.0)
Cholesterol, Total: 218 mg/dL — ABNORMAL HIGH (ref 100–199)
HDL: 35 mg/dL — ABNORMAL LOW (ref >39)
LDL Chol Calc (NIH): 154 mg/dL — ABNORMAL HIGH (ref 0–99)
Triglycerides: 160 mg/dL — ABNORMAL HIGH (ref 0–149)
VLDL Cholesterol Cal: 29 mg/dL (ref 5–40)

## 2021-05-12 LAB — HEMOGLOBIN A1C
Est. average glucose Bld gHb Est-mCnc: 120 mg/dL
Hgb A1c MFr Bld: 5.8 % — ABNORMAL HIGH (ref 4.8–5.6)

## 2021-06-07 ENCOUNTER — Other Ambulatory Visit: Payer: Self-pay

## 2021-06-07 ENCOUNTER — Ambulatory Visit
Admission: RE | Admit: 2021-06-07 | Discharge: 2021-06-07 | Disposition: A | Discharge status: Home / Self Care | Payer: Federal, State, Local not specified - PPO | Source: Ambulatory Visit | Attending: Physician Assistant | Admitting: Physician Assistant

## 2021-06-07 DIAGNOSIS — S199XXA Unspecified injury of neck, initial encounter: Secondary | ICD-10-CM | POA: Diagnosis not present

## 2021-06-07 DIAGNOSIS — M2578 Osteophyte, vertebrae: Secondary | ICD-10-CM | POA: Diagnosis not present

## 2021-06-07 DIAGNOSIS — M542 Cervicalgia: Secondary | ICD-10-CM

## 2021-06-07 DIAGNOSIS — M545 Low back pain, unspecified: Secondary | ICD-10-CM

## 2021-06-07 DIAGNOSIS — M47812 Spondylosis without myelopathy or radiculopathy, cervical region: Secondary | ICD-10-CM | POA: Diagnosis not present

## 2021-06-10 ENCOUNTER — Other Ambulatory Visit: Payer: Self-pay | Admitting: Physician Assistant

## 2021-06-11 ENCOUNTER — Other Ambulatory Visit: Payer: Self-pay | Admitting: Physician Assistant

## 2021-06-11 DIAGNOSIS — F32A Depression, unspecified: Secondary | ICD-10-CM

## 2021-08-04 ENCOUNTER — Other Ambulatory Visit: Payer: Self-pay | Admitting: Physician Assistant

## 2021-08-04 DIAGNOSIS — R351 Nocturia: Secondary | ICD-10-CM

## 2021-08-11 ENCOUNTER — Ambulatory Visit: Payer: Federal, State, Local not specified - PPO | Admitting: Physician Assistant

## 2021-08-17 ENCOUNTER — Other Ambulatory Visit: Payer: Self-pay | Admitting: Physician Assistant

## 2021-08-17 DIAGNOSIS — K219 Gastro-esophageal reflux disease without esophagitis: Secondary | ICD-10-CM

## 2021-09-01 ENCOUNTER — Ambulatory Visit (INDEPENDENT_AMBULATORY_CARE_PROVIDER_SITE_OTHER): Payer: Federal, State, Local not specified - PPO | Admitting: Physician Assistant

## 2021-09-01 ENCOUNTER — Encounter: Payer: Self-pay | Admitting: Physician Assistant

## 2021-09-01 VITALS — BP 136/82 | HR 64 | Temp 97.4°F | Ht 72.0 in | Wt 254.0 lb

## 2021-09-01 DIAGNOSIS — G43709 Chronic migraine without aura, not intractable, without status migrainosus: Secondary | ICD-10-CM | POA: Diagnosis not present

## 2021-09-01 DIAGNOSIS — I10 Essential (primary) hypertension: Secondary | ICD-10-CM | POA: Diagnosis not present

## 2021-09-01 DIAGNOSIS — F339 Major depressive disorder, recurrent, unspecified: Secondary | ICD-10-CM

## 2021-09-01 DIAGNOSIS — E782 Mixed hyperlipidemia: Secondary | ICD-10-CM

## 2021-09-01 DIAGNOSIS — G2581 Restless legs syndrome: Secondary | ICD-10-CM

## 2021-09-01 MED ORDER — GABAPENTIN 100 MG PO CAPS: 100.0000 mg | ORAL_CAPSULE | Freq: Every day | ORAL | 0 refills | Status: DC

## 2021-09-01 MED ORDER — NURTEC 75 MG PO TBDP
ORAL_TABLET | ORAL | 0 refills | Status: DC
Start: 2021-09-01 — End: 2022-11-29

## 2021-09-01 NOTE — Progress Notes (Signed)
Established Patient Office Visit  Subjective:  Patient ID: Bryan Ochoa, male    DOB: [birth year]  Age: 61 y.o. MRN: [id number]  CC:  Chief Complaint  Patient presents with   Follow-up   Hypertension   Hyperlipidemia    HPI Bryan Ochoa presents for follow up on hypertension, hyperlipidemia and mood.  HTN: Pt denies chest pain, palpitations, dizziness or lower extremity swelling. Taking medication as directed without side effects. Does not check blood pressure at home.  HLD: Pt's atorvastatin was on hold until he completed Hep C treatment. Patient reports completed treatment but did not follow-up with specialist because he did not like his bedside manner.   Mood: Patient's wife manages his medication and per his med list still taking Wellbutrin XL 150 mg. Take 150 mg of sertraline. Reports continues to have lack of motivation and episodes of anger and irritability. Patient has no tolerance for "stupid people."  Headache: Patients report no longer taking Depakote because it was not helping his headaches. Has a severe migraine 1-2 times per month but wakes up with a daily headache. In the past has not been on other migraine medications.  RLS: Patient reports restless legs are worsening especially at night. Stopped Mirapex because it was not helping his symptoms and has not tried anything else before.  Past Medical History:  Diagnosis Date   Chronic daily headache    Depression    Deviated nasal septum    Essential hypertension    GERD (gastroesophageal reflux disease)    Hyperlipidemia    [employer]rees[employer] 07/21/2020   Hypertension    [employer]ia 07/21/2020   Insomnia    Mixed hyperlipidemia    Nocturia    Obesity    RLS (restless legs syndrome)    Sinusitis    Sleep apnea     Past Surgical History:  Procedure Laterality Date   BACK SURGERY     CHOLECYSTECTOMY, LAPAROSCOPIC     NASAL SINUS SURGERY     SEPTOPLASTY     SPINE SURGERY N/A    [employer] 07/21/2020   WISDOM TOOTH  EXTRACTION      Family History  Problem Relation Age of Onset   Diabetes Mother    Heart disease Mother    Hypertension Mother    Cancer Mother    Pancreatic cancer Father    Diabetes Father    Hypertension Father    Heart disease Father    Lung disease Father    Alcohol abuse Father    Crohn's disease Sister    COPD Other        NEPHEW   Prostate cancer Maternal Grandfather     Social History   Socioeconomic History   Marital status: Married    Spouse name: Not on file   Number of children: 2   Years of education: ASSOCIATES   Highest education level: Not on file  Occupational History   Not on file  Tobacco Use   Smoking status: Former    Types: Cigarettes    Quit date: 06/12/2014    Years since quitting: 7.2   Smokeless tobacco: Never  Vaping Use   Vaping Use: Never used  Substance and Sexual Activity   Alcohol use: No    Alcohol/week: 0.0 standard drinks   Drug use: No   Sexual activity: Not Currently  Other Topics Concern   Not on file  Social History Narrative   Patient is married with 2 children.   Patient is right handed.  Patient has his Associates degree.   Patient drinks 2 cups daily.   Social Determinants of Health   Financial Resource Strain: Not on file  Food Insecurity: Not on file  Transportation Needs: Not on file  Physical Activity: Not on file  Stress: Not on file  Social Connections: Not on file  Intimate Partner Violence: Not on file    Outpatient Medications Prior to Visit  Medication Sig Dispense Refill   aspirin EC 81 MG tablet Take by mouth.     buPROPion (WELLBUTRIN XL) 300 MG 24 hr tablet Take 1 tablet (300 mg total) by mouth daily. 90 tablet 0   ELDERBERRY PO Take 75 mg by mouth. 2 PO QD     losartan (COZAAR) 100 MG tablet TAKE 1 TABLET BY MOUTH EVERY DAY 90 tablet 0   Multiple Vitamin (MULTI-VITAMINS) TABS Take by mouth.     omeprazole (PRILOSEC) 20 MG capsule TAKE 1 CAPSULE BY MOUTH EVERY DAY 90 capsule 0   sertraline  (ZOLOFT) 100 MG tablet TAKE 1.5 TABLETS (150MG TOTAL) BY MOUTH DAILY 135 tablet 1   tamsulosin (FLOMAX) 0.4 MG CAPS capsule TAKE 1 CAPSULE BY MOUTH EVERY DAY 90 capsule 1   atorvastatin (LIPITOR) 20 MG tablet TAKE 1 TABLET BY MOUTH EVERY DAY (Patient not taking: Reported on 09/01/2021) 90 tablet 0   cetirizine (ZYRTEC) 10 MG tablet Take 10 mg by mouth daily.     divalproex (DEPAKOTE) 500 MG DR tablet TAKE 2 TABLETS BY MOUTH EVERY DAY 120 tablet 0   MAVYRET 100-40 MG TABS Take 3 tablets by mouth daily.     tiZANidine (ZANAFLEX) 2 MG tablet Take 1 tablet (2 mg total) by mouth every 8 (eight) hours as needed for muscle spasms. 15 tablet 0   No facility-administered medications prior to visit.    Allergies  Allergen Reactions   Penicillins Other (See Comments)    Knots all over body. Other reaction(s): Other (See Comments) Knots all over body. Knots all over body.    ROS Review of Systems A fourteen system review of systems was performed and found to be positive as per HPI.    Objective:    Physical Exam General:  Well Developed, well nourished, in no acute distress  Neuro:  Alert and oriented,  extra-ocular muscles intact  HEENT:  Normocephalic, atraumatic, neck supple  Skin:  no gross rash, warm, pink. Cardiac:  RRR, S1 S2 Respiratory: CTA B/L  Vascular:  Ext warm, no cyanosis apprec.; cap RF less 2 sec. Psych:  No HI/SI, judgement and insight good, dysthymic  mood. Normal behavior.  BP 136/82    Pulse 64    Temp (!) 97.4 F (36.3 C)    Ht 6' (1.829 m)    Wt 254 lb (115.2 kg)    SpO2 96%    BMI 34.45 kg/m  Wt Readings from Last 3 Encounters:  09/01/21 254 lb (115.2 kg)  05/11/21 258 lb 3.2 oz (117.1 kg)  10/21/20 277 lb 3.2 oz (125.7 kg)     Health Maintenance Due  Topic Date Due   HIV Screening  Never done   Zoster Vaccines- Shingrix (1 of 2) Never done   COVID-19 Vaccine (3 - Booster for Moderna series) 11/05/2020   INFLUENZA VACCINE  Never done    There are no  preventive care reminders to display for this patient.  Lab Results  Component Value Date   TSH 3.000 11/03/2020   Lab Results  Component Value Date   WBC  7.4 05/11/2021   HGB 15.7 05/11/2021   HCT 45.8 05/11/2021   MCV 84 05/11/2021   PLT 265 05/11/2021   Lab Results  Component Value Date   NA 138 05/11/2021   K 4.3 05/11/2021   CO2 20 05/11/2021   GLUCOSE 91 05/11/2021   BUN 14 05/11/2021   CREATININE 0.81 05/11/2021   BILITOT 0.3 05/11/2021   ALKPHOS 109 05/11/2021   AST 18 05/11/2021   ALT 14 05/11/2021   PROT 7.3 05/11/2021   ALBUMIN 4.6 05/11/2021   CALCIUM 9.4 05/11/2021   EGFR 102 05/11/2021   Lab Results  Component Value Date   CHOL 218 (H) 05/11/2021   Lab Results  Component Value Date   HDL 35 (L) 05/11/2021   Lab Results  Component Value Date   LDLCALC 154 (H) 05/11/2021   Lab Results  Component Value Date   TRIG 160 (H) 05/11/2021   Lab Results  Component Value Date   CHOLHDL 6.2 (H) 05/11/2021   Lab Results  Component Value Date   HGBA1C 5.8 (H) 05/11/2021   Depression screen PHQ 2/9 09/01/2021 05/11/2021 12/09/2020 10/21/2020 03/05/2020  Decreased Interest _0 Down, Depressed, Hopeless 0 1 0 0 0  PHQ - 2 Score _1 Altered sleeping 0 0 0 0 1  Tired, decreased energy _2 Change in appetite _3 Feeling bad or failure about yourself  0 1 0 0 0  Trouble concentrating 0 1 1 0 0  Moving slowly or fidgety/restless 0 0 0 0 0  Suicidal thoughts 0 0 0 0 0  PHQ-9 Score _4 Difficult doing work/chores Somewhat difficult - - - Not difficult at all   GAD 7 : Generalized Anxiety Score 09/01/2021 05/11/2021 03/05/2020 12/25/2019  Nervous, Anxious, on Edge _5 Control/stop worrying 1 2 0 0  Worry too much - different things 1 1 0 0  Trouble relaxing _6 Restless _7 Easily annoyed or irritable _8 Afraid - awful might happen 1 0 0 0  Total GAD 7 Score _9 Anxiety Difficulty Very difficult -  Somewhat difficult Not difficult at all       Assessment & Plan:   Problem List Items Addressed This Visit       Cardiovascular and Mediastinum   Essential (primary) hypertension - Primary     Other   Mixed hyperlipidemia   Restless legs   Relevant Medications   gabapentin (NEURONTIN) 100 MG capsule   Other Visit Diagnoses     Depression, recurrent (HCC)       Chronic migraine without aura without status migrainosus, not intractable       Relevant Medications   Rimegepant Sulfate (NURTEC) 75 MG TBDP   gabapentin (NEURONTIN) 100 MG capsule      Essential hypertension: -Stable. -Continue current medication regimen. -Will continue to monitor.  Mixed hyperlipidemia: -Discussed with patient to contact his gastroenterologist (Dr. Collene Mares) to follow up on Hep C treatment and discuss with her his concerns with Hep C specialist. Will repeat lipid panel with CPE and once patient cleared to resume statin therapy recommend starting pravastatin.   Recurrent depression: -At last visit patient's Wellbutrin XL was increased to 300 mg and advised to ensure his wife is aware, printed updated medication list to take home.  Continue sertraline 150 mg. Will continue to monitor.  Restless legs: -Will start Gabapentin 100 mg to take before bedtime. Discussed with patient potential side effects and advised to let me know if unable to tolerate medication. Will titrate dose as needed to improve symptoms if tolerates medication without issues.  Chronic migraine without aura without status migrainosus, not intractable: -Reviewed prior neurology notes. Patient was started on Depakote [phone] mg for prophylaxis and Imitrex 100 mg prn for acute headache. Discussed with patient alternative treatment options and will trial Nurtec for preventative therapy. Provided samples. Gabapentin can also provide benefit for migraine.   Meds ordered this encounter  Medications   Rimegepant Sulfate (NURTEC) 75 MG TBDP     Sig: Take 1 tablet by mouth x 1 dose for acute headache. (Max 75 mg/day)    Dispense:  16 tablet    Refill:  0    Order Specific Question:   Supervising Provider    Answer:   Beatrice Lecher D [[id number]]   gabapentin (NEURONTIN) 100 MG capsule    Sig: Take 1 capsule (100 mg total) by mouth at bedtime.    Dispense:  90 capsule    Refill:  0    Order Specific Question:   Supervising Provider    Answer:   Beatrice Lecher D [[id number]]    Follow-up: Return in about 3 months (around 11/30/2021) for CPE and FBW .    Lorrene Reid, PA-C

## 2021-09-01 NOTE — Patient Instructions (Signed)
Restless Legs Syndrome ?Restless legs syndrome is a condition that causes uncomfortable feelings or sensations in the legs, especially while sitting or lying down. The sensations usually cause an overwhelming urge to move the legs. The arms can also sometimes be affected. ?The condition can range from mild to severe. The symptoms often interfere with a person's ability to sleep. ?What are the causes? ?The cause of this condition is not known. ?What increases the risk? ?The following factors may make you more likely to develop this condition: ?Being older than 50. ?Pregnancy. ?Being a woman. In general, the condition is more common in women than in men. ?A family history of the condition. ?Having iron deficiency. ?Overuse of caffeine, nicotine, or alcohol. ?Certain medical conditions, such as kidney disease, Parkinson's disease, or nerve damage. ?Certain medicines, such as those for high blood pressure, nausea, colds, allergies, depression, and some heart conditions. ?What are the signs or symptoms? ?The main symptom of this condition is uncomfortable sensations in the legs, such as: ?Pulling. ?Tingling. ?Prickling. ?Throbbing. ?Crawling. ?Burning. ?Usually, the sensations: ?Affect both sides of the body. ?Are worse when you sit or lie down. ?Are worse at night. These may make it difficult to fall asleep. ?Make you have a strong urge to move your legs. ?Are temporarily relieved by moving your legs or standing. ?The arms can also be affected, but this is rare. People who have this condition often have tiredness during the day because of their lack of sleep at night. ?How is this diagnosed? ?This condition may be diagnosed based on: ?Your symptoms. ?Blood tests. ?In some cases, you may be monitored in a sleep lab by a specialist (a sleep study). This can detect any disruptions in your sleep. ?How is this treated? ?This condition is treated by managing the symptoms. This may include: ?Lifestyle changes, such as  exercising, using relaxation techniques, and avoiding caffeine, alcohol, or tobacco. ?Iron supplements. ?Medicines. Parkinson's medications may be tried first. Anti-seizure medications can also be helpful. ?Follow these instructions at home: ?General instructions ?Take over-the-counter and prescription medicines only as told by your health care provider. ?Use methods to help relieve the uncomfortable sensations, such as: ?Massaging your legs. ?Walking or stretching. ?Taking a cold or hot bath. ?Keep all follow-up visits. This is important. ?Lifestyle ?  ?Practice good sleep habits. For example, go to bed and get up at the same time every day. Most adults should get 7-9 hours of sleep each night. ?Exercise regularly. Try to get at least 30 minutes of exercise most days of the week. ?Practice ways of relaxing, such as yoga or meditation. ?Avoid caffeine and alcohol. ?Do not use any products that contain nicotine or tobacco. These products include cigarettes, chewing tobacco, and vaping devices, such as e-cigarettes. If you need help quitting, ask your health care provider. ?Where to find more information ?National Institute of Neurological Disorders and Stroke: www.ninds.nih.gov ?Contact a health care provider if: ?Your symptoms get worse or they do not improve with treatment. ?Summary ?Restless legs syndrome is a condition that causes uncomfortable feelings or sensations in the legs, especially while sitting or lying down. ?The symptoms often interfere with your ability to sleep. ?This condition is treated by managing the symptoms. You may need to make lifestyle changes or take medicines. ?This information is not intended to replace advice given to you by your health care provider. Make sure you discuss any questions you have with your health care provider. ?Document Revised: 03/06/2021 Document Reviewed: 03/06/2021 ?Elsevier Patient Education ? 2022   Elsevier Inc. ? ?

## 2021-09-10 ENCOUNTER — Other Ambulatory Visit: Payer: Self-pay | Admitting: Physician Assistant

## 2021-09-28 DIAGNOSIS — B182 Chronic viral hepatitis C: Secondary | ICD-10-CM | POA: Diagnosis not present

## 2021-09-28 DIAGNOSIS — R945 Abnormal results of liver function studies: Secondary | ICD-10-CM | POA: Diagnosis not present

## 2021-11-16 ENCOUNTER — Telehealth: Payer: Federal, State, Local not specified - PPO | Admitting: Physician Assistant

## 2021-11-16 DIAGNOSIS — B9689 Other specified bacterial agents as the cause of diseases classified elsewhere: Secondary | ICD-10-CM

## 2021-11-16 DIAGNOSIS — J019 Acute sinusitis, unspecified: Secondary | ICD-10-CM | POA: Diagnosis not present

## 2021-11-16 MED ORDER — DOXYCYCLINE HYCLATE 100 MG PO TABS: 100.0000 mg | ORAL_TABLET | Freq: Two times a day (BID) | ORAL | 0 refills | Status: DC

## 2021-11-16 NOTE — Patient Instructions (Signed)
?[name], thank you for joining Margaretann Loveless, PA-C for today's virtual visit.  While this provider is not your primary care provider (PCP), if your PCP is located in our provider database this encounter information will be shared with them immediately following your visit. ? ?Consent: ?(Patient) Bryan Ochoa provided verbal consent for this virtual visit at the beginning of the encounter. ? ?Current Medications: ? ?Current Outpatient Medications:  ?  doxycycline (VIBRA-TABS) 100 MG tablet, Take 1 tablet (100 mg total) by mouth 2 (two) times daily., Disp: 20 tablet, Rfl: 0 ?  aspirin EC 81 MG tablet, Take by mouth., Disp: , Rfl:  ?  buPROPion (WELLBUTRIN XL) 300 MG 24 hr tablet, Take 1 tablet (300 mg total) by mouth daily., Disp: 90 tablet, Rfl: 0 ?  ELDERBERRY PO, Take 75 mg by mouth. 2 PO QD, Disp: , Rfl:  ?  gabapentin (NEURONTIN) 100 MG capsule, Take 1 capsule (100 mg total) by mouth at bedtime., Disp: 90 capsule, Rfl: 0 ?  losartan (COZAAR) 100 MG tablet, TAKE 1 TABLET BY MOUTH EVERY DAY, Disp: 90 tablet, Rfl: 0 ?  Multiple Vitamin (MULTI-VITAMINS) TABS, Take by mouth., Disp: , Rfl:  ?  omeprazole (PRILOSEC) 20 MG capsule, TAKE 1 CAPSULE BY MOUTH EVERY DAY, Disp: 90 capsule, Rfl: 0 ?  Rimegepant Sulfate (NURTEC) 75 MG TBDP, Take 1 tablet by mouth x 1 dose for acute headache. (Max 75 mg/day), Disp: 16 tablet, Rfl: 0 ?  sertraline (ZOLOFT) 100 MG tablet, TAKE 1.5 TABLETS (150MG  TOTAL) BY MOUTH DAILY, Disp: 135 tablet, Rfl: 1 ?  tamsulosin (FLOMAX) 0.4 MG CAPS capsule, TAKE 1 CAPSULE BY MOUTH EVERY DAY, Disp: 90 capsule, Rfl: 1  ? ?Medications ordered in this encounter:  ?Meds ordered this encounter  ?Medications  ? doxycycline (VIBRA-TABS) 100 MG tablet  ?  Sig: Take 1 tablet (100 mg total) by mouth 2 (two) times daily.  ?  Dispense:  20 tablet  ?  Refill:  0  ?  Order Specific Question:   Supervising Provider  ?  Answer:   [[id number]]  ?  ? ?*If you need refills on other medications  prior to your next appointment, please contact your pharmacy* ? ?Follow-Up: ?Call back or seek an in-person evaluation if the symptoms worsen or if the condition fails to improve as anticipated. ? ?Other Instructions ? ?Sinusitis, Adult ?Sinusitis is inflammation of your sinuses. Sinuses are hollow spaces in the bones around your face. Your sinuses are located: ?Around your eyes. ?In the middle of your forehead. ?Behind your nose. ?In your cheekbones. ?Mucus normally drains out of your sinuses. When your nasal tissues become inflamed or swollen, mucus can become trapped or blocked. This allows bacteria, viruses, and fungi to grow, which leads to infection. Most infections of the sinuses are caused by a virus. ?Sinusitis can develop quickly. It can last for up to 4 weeks (acute) or for more than 12 weeks (chronic). Sinusitis often develops after a cold. ?What are the causes? ?This condition is caused by anything that creates swelling in the sinuses or stops mucus from draining. This includes: ?Allergies. ?Asthma. ?Infection from bacteria or viruses. ?Deformities or blockages in your nose or sinuses. ?Abnormal growths in the nose (nasal polyps). ?Pollutants, such as chemicals or irritants in the air. ?Infection from fungi (rare). ?What increases the risk? ?You are more likely to develop this condition if you: ?Have a weak body defense system (immune system). ?Do a lot of swimming or  diving. ?Overuse nasal sprays. ?Smoke. ?What are the signs or symptoms? ?The main symptoms of this condition are pain and a feeling of pressure around the affected sinuses. Other symptoms include: ?Stuffy nose or congestion. ?Thick drainage from your nose. ?Swelling and warmth over the affected sinuses. ?Headache. ?Upper toothache. ?A cough that may get worse at night. ?Extra mucus that collects in the throat or the back of the nose (postnasal drip). ?Decreased sense of smell and taste. ?Fatigue. ?A fever. ?Sore throat. ?Bad breath. ?How  is this diagnosed? ?This condition is diagnosed based on: ?Your symptoms. ?Your medical history. ?A physical exam. ?Tests to find out if your condition is acute or chronic. This may include: ?Checking your nose for nasal polyps. ?Viewing your sinuses using a device that has a light (endoscope). ?Testing for allergies or bacteria. ?Imaging tests, such as an MRI or CT scan. ?In rare cases, a bone biopsy may be done to rule out more serious types of fungal sinus disease. ?How is this treated? ?Treatment for sinusitis depends on the cause and whether your condition is chronic or acute. ?If caused by a virus, your symptoms should go away on their own within 10 days. You may be given medicines to relieve symptoms. They include: ?Medicines that shrink swollen nasal passages (topical intranasal decongestants). ?Medicines that treat allergies (antihistamines). ?A spray that eases inflammation of the nostrils (topical intranasal corticosteroids). ?Rinses that help get rid of thick mucus in your nose (nasal saline washes). ?If caused by bacteria, your health care provider may recommend waiting to see if your symptoms improve. Most bacterial infections will get better without antibiotic medicine. You may be given antibiotics if you have: ?A severe infection. ?A weak immune system. ?If caused by narrow nasal passages or nasal polyps, you may need to have surgery. ?Follow these instructions at home: ?Medicines ?Take, use, or apply over-the-counter and prescription medicines only as told by your health care provider. These may include nasal sprays. ?If you were prescribed an antibiotic medicine, take it as told by your health care provider. Do not stop taking the antibiotic even if you start to feel better. ?Hydrate and humidify ? ?Drink enough fluid to keep your urine pale yellow. Staying hydrated will help to thin your mucus. ?Use a cool mist humidifier to keep the humidity level in your home above 50%. ?Inhale steam for 10-15  minutes, 3-4 times a day, or as told by your health care provider. You can do this in the bathroom while a hot shower is running. ?Limit your exposure to cool or dry air. ?Rest ?Rest as much as possible. ?Sleep with your head raised (elevated). ?Make sure you get enough sleep each night. ?General instructions ? ?Apply a warm, moist washcloth to your face 3-4 times a day or as told by your health care provider. This will help with discomfort. ?Wash your hands often with soap and water to reduce your exposure to germs. If soap and water are not available, use hand sanitizer. ?Do not smoke. Avoid being around people who are smoking (secondhand smoke). ?Keep all follow-up visits as told by your health care provider. This is important. ?Contact a health care provider if: ?You have a fever. ?Your symptoms get worse. ?Your symptoms do not improve within 10 days. ?Get help right away if: ?You have a severe headache. ?You have persistent vomiting. ?You have severe pain or swelling around your face or eyes. ?You have vision problems. ?You develop confusion. ?Your neck is stiff. ?You have trouble breathing. ?  Summary ?Sinusitis is soreness and inflammation of your sinuses. Sinuses are hollow spaces in the bones around your face. ?This condition is caused by nasal tissues that become inflamed or swollen. The swelling traps or blocks the flow of mucus. This allows bacteria, viruses, and fungi to grow, which leads to infection. ?If you were prescribed an antibiotic medicine, take it as told by your health care provider. Do not stop taking the antibiotic even if you start to feel better. ?Keep all follow-up visits as told by your health care provider. This is important. ?This information is not intended to replace advice given to you by your health care provider. Make sure you discuss any questions you have with your health care provider. ?Document Revised: 12/24/2017 Document Reviewed: 12/24/2017 ?Elsevier Patient Education ? 2022  [employer] Inc. ? ? ? ?If you have been instructed to have an in-person evaluation today at a local Urgent Care facility, please use the link below. It will take you to a list of all of our available [facility]

## 2021-11-16 NOTE — Progress Notes (Signed)
?Virtual Visit Consent  ? ?Bryan Ochoa, you are scheduled for a virtual visit with a [facility] provider today.   ?  ?Just as with appointments in the office, your consent must be obtained to participate.  Your consent will be active for this visit and any virtual visit you may have with one of our providers in the next 365 days.   ?  ?If you have a MyChart account, a copy of this consent can be sent to you electronically.  All virtual visits are billed to your insurance company just like a traditional visit in the office.   ? ?As this is a virtual visit, video technology does not allow for your provider to perform a traditional examination.  This may limit your provider's ability to fully assess your condition.  If your provider identifies any concerns that need to be evaluated in person or the need to arrange testing (such as labs, EKG, etc.), we will make arrangements to do so.   ?  ?Although advances in technology are sophisticated, we cannot ensure that it will always work on either your end or our end.  If the connection with a video visit is poor, the visit may have to be switched to a telephone visit.  With either a video or telephone visit, we are not always able to ensure that we have a secure connection.    ? ?I need to obtain your verbal consent now.   Are you willing to proceed with your visit today?  ?  ?Bryan Ochoa has provided verbal consent on 11/16/2021 for a virtual visit (video or telephone). ?  ?Margaretann Loveless, PA-C  ? ?Date: 11/16/2021 6:20 PM ? ? ?Virtual Visit via Video Note  ? ?Bryan Ochoa, connected with  BERLE FITZ  ([id number], 1960-10-08) on 11/16/21 at  6:15 PM EDT by a video-enabled telemedicine application and verified that I am speaking with the correct person using two identifiers. ? ?Location: ?Patient: Virtual Visit Location Patient: Home ?Provider: Virtual Visit Location Provider: Home Office ?  ?I discussed the limitations of evaluation and management by  telemedicine and the availability of in person appointments. The patient expressed understanding and agreed to proceed.   ? ?History of Present Illness: ?Bryan Ochoa is a 61 y.o. who identifies as a male who was assigned male at birth, and is being seen today for possible sinus infection. ? ?HPI: Sinusitis ?This is a new problem. The current episode started in the past 7 days. The problem has been gradually worsening since onset. There has been no fever. The pain is moderate. Associated symptoms include congestion, coughing, ear pain (fullness), headaches, sinus pressure and a sore throat. Pertinent negatives include no chills or hoarse voice. Past treatments include oral decongestants. The treatment provided no relief.   ?H/O sinus surgery x 2 ? ?Problems:  ?Patient Active Problem List  ? Diagnosis Date Noted  ? Serum calcium elevated 04/10/2019  ? Restless legs 02/28/2018  ? Chronic pansinusitis 06/11/2017  ? Deviated nasal septum 06/11/2017  ? Hypertrophy of inferior nasal turbinate 06/11/2017  ? Nasal obstruction 06/11/2017  ? Nocturia 04/24/2017  ? Recurrent sinusitis 09/05/2016  ? Depression 10/26/2015  ? GERD (gastroesophageal reflux disease) 10/26/2015  ? Mixed hyperlipidemia 10/26/2015  ? Obesity 09/29/2014  ? Primary snoring 09/29/2014  ? Cerebral infarction due to unspecified mechanism 09/29/2014  ? Chronic daily headache 06/19/2014  ? Analgesic rebound headache 06/19/2014  ? Sleep apnea 06/19/2014  ? Drug-induced headache,  not elsewhere classified, not intractable 06/19/2014  ? Essential (primary) hypertension 05/12/2014  ? Low back pain 05/12/2014  ?  ?Allergies:  ?Allergies  ?Allergen Reactions  ? Penicillins Other (See Comments)  ?  Knots all over body. ?Other reaction(s): Other (See Comments) ?Knots all over body. ?Knots all over body.  ? ?Medications:  ?Current Outpatient Medications:  ?  doxycycline (VIBRA-TABS) 100 MG tablet, Take 1 tablet (100 mg total) by mouth 2 (two) times daily., Disp: 20  tablet, Rfl: 0 ?  aspirin EC 81 MG tablet, Take by mouth., Disp: , Rfl:  ?  buPROPion (WELLBUTRIN XL) 300 MG 24 hr tablet, Take 1 tablet (300 mg total) by mouth daily., Disp: 90 tablet, Rfl: 0 ?  ELDERBERRY PO, Take 75 mg by mouth. 2 PO QD, Disp: , Rfl:  ?  gabapentin (NEURONTIN) 100 MG capsule, Take 1 capsule (100 mg total) by mouth at bedtime., Disp: 90 capsule, Rfl: 0 ?  losartan (COZAAR) 100 MG tablet, TAKE 1 TABLET BY MOUTH EVERY DAY, Disp: 90 tablet, Rfl: 0 ?  Multiple Vitamin (MULTI-VITAMINS) TABS, Take by mouth., Disp: , Rfl:  ?  omeprazole (PRILOSEC) 20 MG capsule, TAKE 1 CAPSULE BY MOUTH EVERY DAY, Disp: 90 capsule, Rfl: 0 ?  Rimegepant Sulfate (NURTEC) 75 MG TBDP, Take 1 tablet by mouth x 1 dose for acute headache. (Max 75 mg/day), Disp: 16 tablet, Rfl: 0 ?  sertraline (ZOLOFT) 100 MG tablet, TAKE 1.5 TABLETS (150MG  TOTAL) BY MOUTH DAILY, Disp: 135 tablet, Rfl: 1 ?  tamsulosin (FLOMAX) 0.4 MG CAPS capsule, TAKE 1 CAPSULE BY MOUTH EVERY DAY, Disp: 90 capsule, Rfl: 1 ? ?Observations/Objective: ?Patient is well-developed, well-nourished in no acute distress.  ?Resting comfortably at home.  ?Head is normocephalic, atraumatic.  ?No labored breathing.  ?Speech is clear and coherent with logical content.  ?Patient is alert and oriented at baseline.  ? ? ?Assessment and Plan: ?1. Acute bacterial sinusitis ?- doxycycline (VIBRA-TABS) 100 MG tablet; Take 1 tablet (100 mg total) by mouth 2 (two) times daily.  Dispense: 20 tablet; Refill: 0 ? ?- Worsening symptoms that have not responded to OTC medications.  ?- Will give Doxycycline.  ?- Continue allergy medications.  ?- Steam and Humidifier can help ?- Stay well hydrated and get plenty of rest.  ?- Seek in person evaluation if no symptom improvement or if symptoms worsen. ? ? ?Follow Up Instructions: ?I discussed the assessment and treatment plan with the patient. The patient was provided an opportunity to ask questions and all were answered. The patient agreed with  the plan and demonstrated an understanding of the instructions.  A copy of instructions were sent to the patient via MyChart unless otherwise noted below.  ? ? ?The patient was advised to call back or seek an in-person evaluation if the symptoms worsen or if the condition fails to improve as anticipated. ? ?Time:  ?I spent 10 minutes with the patient via telehealth technology discussing the above problems/concerns.   ? ? , PA-C ?

## 2021-11-24 DIAGNOSIS — Z6834 Body mass index (BMI) 34.0-34.9, adult: Secondary | ICD-10-CM | POA: Diagnosis not present

## 2021-11-24 DIAGNOSIS — M545 Low back pain, unspecified: Secondary | ICD-10-CM | POA: Diagnosis not present

## 2021-11-24 DIAGNOSIS — M47816 Spondylosis without myelopathy or radiculopathy, lumbar region: Secondary | ICD-10-CM | POA: Diagnosis not present

## 2021-12-01 ENCOUNTER — Encounter: Payer: Self-pay | Admitting: Physician Assistant

## 2021-12-01 ENCOUNTER — Ambulatory Visit (INDEPENDENT_AMBULATORY_CARE_PROVIDER_SITE_OTHER): Payer: Federal, State, Local not specified - PPO | Admitting: Physician Assistant

## 2021-12-01 VITALS — BP 125/87 | HR 76 | Temp 97.8°F | Ht 72.0 in | Wt 252.0 lb

## 2021-12-01 DIAGNOSIS — Z6835 Body mass index (BMI) 35.0-35.9, adult: Secondary | ICD-10-CM

## 2021-12-01 DIAGNOSIS — R7303 Prediabetes: Secondary | ICD-10-CM | POA: Diagnosis not present

## 2021-12-01 DIAGNOSIS — Z1329 Encounter for screening for other suspected endocrine disorder: Secondary | ICD-10-CM | POA: Diagnosis not present

## 2021-12-01 DIAGNOSIS — Z125 Encounter for screening for malignant neoplasm of prostate: Secondary | ICD-10-CM

## 2021-12-01 DIAGNOSIS — I10 Essential (primary) hypertension: Secondary | ICD-10-CM

## 2021-12-01 DIAGNOSIS — Z13228 Encounter for screening for other metabolic disorders: Secondary | ICD-10-CM

## 2021-12-01 DIAGNOSIS — Z1321 Encounter for screening for nutritional disorder: Secondary | ICD-10-CM

## 2021-12-01 DIAGNOSIS — Z0000 Encounter for general adult medical examination without abnormal findings: Secondary | ICD-10-CM

## 2021-12-01 DIAGNOSIS — E782 Mixed hyperlipidemia: Secondary | ICD-10-CM

## 2021-12-01 DIAGNOSIS — G2581 Restless legs syndrome: Secondary | ICD-10-CM

## 2021-12-01 DIAGNOSIS — B182 Chronic viral hepatitis C: Secondary | ICD-10-CM | POA: Insufficient documentation

## 2021-12-01 DIAGNOSIS — Z130 Encounter for screening for diseases of the blood and blood-forming organs and certain disorders involving the immune mechanism: Secondary | ICD-10-CM

## 2021-12-01 MED ORDER — GABAPENTIN 100 MG PO CAPS: 200.0000 mg | ORAL_CAPSULE | Freq: Every day | ORAL | 0 refills | Status: DC

## 2021-12-01 NOTE — Progress Notes (Signed)
? ?Complete physical exam ? ? ?Patient: Bryan Ochoa   DOB: 03/29/1961   62 y.o. Male  MRN: [id number] ?Visit Date: 12/01/2021 ? ? ?Chief Complaint  ?Patient presents with  ? Annual Exam  ? ?Subjective  ?  ?Bryan Ochoa is a 61 y.o. male who presents today for a complete physical exam.  ?He reports consuming a general diet. The patient does not participate in regular exercise at present. He generally feels fairly well. He does not have additional problems to discuss today.  ? ? ?Past Medical History:  ?Diagnosis Date  ? Chronic daily headache   ? Depression   ? Deviated nasal septum   ? Essential hypertension   ? GERD (gastroesophageal reflux disease)   ? Hyperlipidemia   ? [employer]rees[employer] 07/21/2020  ? Hypertension   ? [employer] 07/21/2020  ? Insomnia   ? Mixed hyperlipidemia   ? Nocturia   ? Obesity   ? RLS (restless legs syndrome)   ? Sinusitis   ? Sleep apnea   ? ?Past Surgical History:  ?Procedure Laterality Date  ? BACK SURGERY    ? CHOLECYSTECTOMY, LAPAROSCOPIC    ? NASAL SINUS SURGERY    ? SEPTOPLASTY    ? SPINE SURGERY N/A   ? [employer] 07/21/2020  ? WISDOM TOOTH EXTRACTION    ? ?Social History  ? ?Socioeconomic History  ? Marital status: Married  ?  Spouse name: Not on file  ? Number of children: 2  ? Years of education: ASSOCIATES  ? Highest education level: Not on file  ?Occupational History  ? Not on file  ?Tobacco Use  ? Smoking status: Former  ?  Types: Cigarettes  ?  Quit date: 06/12/2014  ?  Years since quitting: 7.4  ? Smokeless tobacco: Never  ?Vaping Use  ? Vaping Use: Never used  ?Substance and Sexual Activity  ? Alcohol use: No  ?  Alcohol/week: 0.0 standard drinks  ? Drug use: No  ? Sexual activity: Not Currently  ?Other Topics Concern  ? Not on file  ?Social History Narrative  ? Patient is married with 2 children.  ? Patient is right handed.  ? Patient has his Associates degree.  ? Patient drinks 2 cups daily.  ? ?Social Determinants of Health  ? ?Financial Resource Strain: Not on file  ?Food  Insecurity: Not on file  ?Transportation Needs: Not on file  ?Physical Activity: Not on file  ?Stress: Not on file  ?Social Connections: Not on file  ?Intimate Partner Violence: Not on file  ? ? ? ?Medications: ?Outpatient Medications Prior to Visit  ?Medication Sig  ? aspirin EC 81 MG tablet Take by mouth.  ? buPROPion (WELLBUTRIN XL) 300 MG 24 hr tablet Take 1 tablet (300 mg total) by mouth daily.  ? doxycycline (VIBRA-TABS) 100 MG tablet Take 1 tablet (100 mg total) by mouth 2 (two) times daily.  ? ELDERBERRY PO Take 75 mg by mouth. 2 PO QD  ? losartan (COZAAR) 100 MG tablet TAKE 1 TABLET BY MOUTH EVERY DAY  ? Multiple Vitamin (MULTI-VITAMINS) TABS Take by mouth.  ? omeprazole (PRILOSEC) 20 MG capsule TAKE 1 CAPSULE BY MOUTH EVERY DAY  ? Rimegepant Sulfate (NURTEC) 75 MG TBDP Take 1 tablet by mouth x 1 dose for acute headache. (Max 75 mg/day)  ? sertraline (ZOLOFT) 100 MG tablet TAKE 1.5 TABLETS (150MG TOTAL) BY MOUTH DAILY  ? tamsulosin (FLOMAX) 0.4 MG CAPS capsule TAKE 1 CAPSULE BY MOUTH EVERY DAY  ? [DISCONTINUED]  gabapentin (NEURONTIN) 100 MG capsule Take 1 capsule (100 mg total) by mouth at bedtime.  ? ?No facility-administered medications prior to visit.  ? ? ?Review of Systems ?Review of Systems:  ?A fourteen system review of systems was performed and found to be positive as per HPI. ? ? ?Last CBC ?Lab Results  ?Component Value Date  ? WBC 7.4 05/11/2021  ? HGB 15.7 05/11/2021  ? HCT 45.8 05/11/2021  ? MCV 84 05/11/2021  ? MCH 28.8 05/11/2021  ? RDW 14.1 05/11/2021  ? PLT 265 05/11/2021  ? ?Last metabolic panel ?Lab Results  ?Component Value Date  ? GLUCOSE 91 05/11/2021  ? NA 138 05/11/2021  ? K 4.3 05/11/2021  ? CL 104 05/11/2021  ? CO2 20 05/11/2021  ? BUN 14 05/11/2021  ? CREATININE 0.81 05/11/2021  ? EGFR 102 05/11/2021  ? CALCIUM 9.4 05/11/2021  ? PROT 7.3 05/11/2021  ? ALBUMIN 4.6 05/11/2021  ? LABGLOB 2.7 05/11/2021  ? AGRATIO 1.7 05/11/2021  ? BILITOT 0.3 05/11/2021  ? ALKPHOS 109 05/11/2021  ?  AST 18 05/11/2021  ? ALT 14 05/11/2021  ? ?Last lipids ?Lab Results  ?Component Value Date  ? CHOL 218 (H) 05/11/2021  ? HDL 35 (L) 05/11/2021  ? LDLCALC 154 (H) 05/11/2021  ? TRIG 160 (H) 05/11/2021  ? CHOLHDL 6.2 (H) 05/11/2021  ? ?Last hemoglobin A1c ?Lab Results  ?Component Value Date  ? HGBA1C 5.8 (H) 05/11/2021  ? ?Last thyroid functions ?Lab Results  ?Component Value Date  ? TSH 3.000 11/03/2020  ? ?  ? Objective  ? ?  ?BP 125/87   Pulse 76   Temp 97.8 ?F (36.6 ?C)   Ht 6' (1.829 m)   Wt 252 lb (114.3 kg)   SpO2 98%   BMI 34.18 kg/m?  ?BP Readings from Last 3 Encounters:  ?12/01/21 125/87  ?09/02/21 136/82  ?05/11/21 138/90  ? ?Wt Readings from Last 3 Encounters:  ?12/01/21 252 lb (114.3 kg)  ?09/01/21 254 lb (115.2 kg)  ?05/11/21 258 lb 3.2 oz (117.1 kg)  ? ? ?Physical Exam  ? ?General Appearance:    Alert, cooperative, in no acute distress, appears stated age  ?Head:    Normocephalic, without obvious abnormality, atraumatic  ?Eyes:    PERRL, conjunctiva/corneas clear, EOM's intact, fundi  ?  benign, both eyes       ?Ears:    Normal TM's and external ear canals, both ears  ?Nose:   Nares normal, septum midline, mucosa normal, no drainage ?  or sinus tenderness  ?Throat:   Lips, mucosa, and tongue normal; teeth and gums normal  ?Neck:   Supple, symmetrical, trachea midline, no adenopathy;     ?  thyroid:  No tenderness/nodules; no JVD  ?Back:     Symmetric, no curvature, ROM normal, no CVA tenderness  ?Lungs:     Clear to auscultation bilaterally, respirations unlabored  ?Chest wall:    No tenderness or deformity  ?Heart:    Normal heart rate. Normal rhythm. No murmurs, rubs, or gallops.  S1 and S2 normal  ?Abdomen:     Soft, non-tender, bowel sounds active all four quadrants,  ?  no masses, no organomegaly  ?Genitalia:    deferred  ?Rectal:    deferred  ?Extremities:   All extremities are intact. No cyanosis or edema  ?Pulses:   2+ and symmetric all extremities  ?Skin:   Skin color, texture, turgor  normal, no rashes or lesions  ?Lymph nodes:  Cervical and supraclavicular  nodes normal  ?Neurologic:   CNII-XII grossly intact.   ? ? ? ?Last depression screening scores ? ?  09/01/2021  ?  4:34 PM 05/11/2021  ?  2:24 PM 12/09/2020  ?  3:16 PM  ?PHQ 2/9 Scores  ?PHQ - 2 Score _0 ?PHQ- 9 Score _1 ? ?Last fall risk screening ? ?  09/01/2021  ?  3:55 PM  ?Fall Risk   ?Falls in the past year? 0  ?Number falls in past yr: 0  ?Injury with Fall? 0  ?Risk for fall due to : No Fall Risks  ?Follow up Falls evaluation completed  ? ? ? ?No results found for any visits on 12/01/21. ? Assessment & Plan  ?  ?Routine Health Maintenance and Physical Exam ? ?Exercise Activities and Dietary recommendations ?-Discussed heart healthy diet low in fat and carbohydrates.  ? ?Immunization History  ?Administered Date(s) Administered  ? Moderna Sars-Covid-2 Vaccination 04/15/2020, 09/10/2020  ? Tdap 03/03/2015  ? ? ?Health Maintenance  ?Topic Date Due  ? HIV Screening  Never done  ? Zoster Vaccines- Shingrix (1 of 2) Never done  ? COVID-19 Vaccine (3 - Booster for Moderna series) 11/05/2020  ? INFLUENZA VACCINE  03/07/2022  ? TETANUS/TDAP  03/02/2025  ? COLONOSCOPY (Pts 45-66yr Insurance coverage will need to be confirmed)  01/22/2030  ? Hepatitis C Screening  Completed  ? HPV VACCINES  Aged Out  ? ? ?Discussed health benefits of physical activity, and encouraged him to engage in regular exercise appropriate for his age and condition. ? ?Problem List Items Addressed This Visit   ? ?  ? Cardiovascular and Mediastinum  ? Essential (primary) hypertension  ? Relevant Orders  ? Comprehensive metabolic panel  ?  ? Other  ? Mixed hyperlipidemia  ? Relevant Orders  ? Lipid panel  ? Restless legs  ? Relevant Medications  ? gabapentin (NEURONTIN) 100 MG capsule  ? ?Other Visit Diagnoses   ? ? Healthcare maintenance    -  Primary  ? Relevant Orders  ? TSH  ? Lipid panel  ? Hemoglobin A1c  ? Comprehensive metabolic panel  ? CBC with  Differential/Platelet  ? PSA  ? Screening for prostate cancer      ? Relevant Orders  ? PSA  ? Prediabetes      ? Relevant Orders  ? Hemoglobin A1c  ? Screening for endocrine, nutritional, metabolic and immunity disorder      ? Rele

## 2021-12-01 NOTE — Patient Instructions (Signed)

## 2021-12-02 LAB — CBC WITH DIFFERENTIAL/PLATELET
Basophils Absolute: 0.1 10*3/uL (ref 0.0–0.2)
Basos: 1 %
EOS (ABSOLUTE): 0.3 10*3/uL (ref 0.0–0.4)
Eos: 3 %
Hematocrit: 47.3 % (ref 37.5–51.0)
Hemoglobin: 15.5 g/dL (ref 13.0–17.7)
Immature Grans (Abs): 0 10*3/uL (ref 0.0–0.1)
Immature Granulocytes: 0 %
Lymphocytes Absolute: 2.6 10*3/uL (ref 0.7–3.1)
Lymphs: 30 %
MCH: 28.1 pg (ref 26.6–33.0)
MCHC: 32.8 g/dL (ref 31.5–35.7)
MCV: 86 fL (ref 79–97)
Monocytes Absolute: 0.7 10*3/uL (ref 0.1–0.9)
Monocytes: 9 %
Neutrophils Absolute: 4.7 10*3/uL (ref 1.4–7.0)
Neutrophils: 57 %
Platelets: 300 10*3/uL (ref 150–450)
RBC: 5.52 x10E6/uL (ref 4.14–5.80)
RDW: 14.5 % (ref 11.6–15.4)
WBC: 8.4 10*3/uL (ref 3.4–10.8)

## 2021-12-02 LAB — LIPID PANEL
Chol/HDL Ratio: 6.4 ratio — ABNORMAL HIGH (ref 0.0–5.0)
Cholesterol, Total: 243 mg/dL — ABNORMAL HIGH (ref 100–199)
HDL: 38 mg/dL — ABNORMAL LOW (ref >39)
LDL Chol Calc (NIH): 173 mg/dL — ABNORMAL HIGH (ref 0–99)
Triglycerides: 172 mg/dL — ABNORMAL HIGH (ref 0–149)
VLDL Cholesterol Cal: 32 mg/dL (ref 5–40)

## 2021-12-02 LAB — COMPREHENSIVE METABOLIC PANEL WITH GFR
ALT: 18 IU/L (ref 0–44)
AST: 27 IU/L (ref 0–40)
Albumin/Globulin Ratio: 2 (ref 1.2–2.2)
Albumin: 4.5 g/dL (ref 3.8–4.9)
Alkaline Phosphatase: 91 IU/L (ref 44–121)
BUN/Creatinine Ratio: 19 (ref 9–20)
BUN: 17 mg/dL (ref 6–24)
Bilirubin Total: 0.3 mg/dL (ref 0.0–1.2)
CO2: 22 mmol/L (ref 20–29)
Calcium: 9.5 mg/dL (ref 8.7–10.2)
Chloride: 102 mmol/L (ref 96–106)
Creatinine, Ser: 0.9 mg/dL (ref 0.76–1.27)
Globulin, Total: 2.3 g/dL (ref 1.5–4.5)
Glucose: 81 mg/dL (ref 70–99)
Potassium: 5.8 mmol/L — ABNORMAL HIGH (ref 3.5–5.2)
Sodium: 137 mmol/L (ref 134–144)
Total Protein: 6.8 g/dL (ref 6.0–8.5)
eGFR: 98 mL/min/1.73 (ref >59)

## 2021-12-02 LAB — HEMOGLOBIN A1C
Est. average glucose Bld gHb Est-mCnc: 123 mg/dL
Hgb A1c MFr Bld: 5.9 % — ABNORMAL HIGH (ref 4.8–5.6)

## 2021-12-02 LAB — TSH: TSH: 4 u[IU]/mL (ref 0.450–4.500)

## 2021-12-02 LAB — PSA: Prostate Specific Ag, Serum: 1 ng/mL (ref 0.0–4.0)

## 2021-12-08 ENCOUNTER — Other Ambulatory Visit: Payer: Self-pay | Admitting: Physician Assistant

## 2021-12-08 DIAGNOSIS — G2581 Restless legs syndrome: Secondary | ICD-10-CM

## 2021-12-08 DIAGNOSIS — K219 Gastro-esophageal reflux disease without esophagitis: Secondary | ICD-10-CM

## 2021-12-11 ENCOUNTER — Other Ambulatory Visit: Payer: Self-pay | Admitting: Physician Assistant

## 2021-12-11 DIAGNOSIS — G2581 Restless legs syndrome: Secondary | ICD-10-CM

## 2021-12-13 ENCOUNTER — Other Ambulatory Visit: Payer: Self-pay | Admitting: Physician Assistant

## 2021-12-13 DIAGNOSIS — E782 Mixed hyperlipidemia: Secondary | ICD-10-CM

## 2021-12-13 DIAGNOSIS — I10 Essential (primary) hypertension: Secondary | ICD-10-CM

## 2021-12-13 DIAGNOSIS — Z0000 Encounter for general adult medical examination without abnormal findings: Secondary | ICD-10-CM

## 2021-12-15 ENCOUNTER — Other Ambulatory Visit: Payer: Federal, State, Local not specified - PPO

## 2021-12-15 DIAGNOSIS — Z0000 Encounter for general adult medical examination without abnormal findings: Secondary | ICD-10-CM

## 2021-12-15 DIAGNOSIS — E782 Mixed hyperlipidemia: Secondary | ICD-10-CM | POA: Diagnosis not present

## 2021-12-15 DIAGNOSIS — R7303 Prediabetes: Secondary | ICD-10-CM | POA: Diagnosis not present

## 2021-12-15 DIAGNOSIS — I10 Essential (primary) hypertension: Secondary | ICD-10-CM

## 2021-12-16 LAB — COMPREHENSIVE METABOLIC PANEL
ALT: 16 IU/L (ref 0–44)
AST: 12 IU/L (ref 0–40)
Albumin/Globulin Ratio: 1.5 (ref 1.2–2.2)
Albumin: 4.3 g/dL (ref 3.8–4.9)
Alkaline Phosphatase: 111 IU/L (ref 44–121)
BUN/Creatinine Ratio: 20 (ref 9–20)
BUN: 17 mg/dL (ref 6–24)
Bilirubin Total: <0.2 mg/dL (ref 0.0–1.2)
CO2: 16 mmol/L — ABNORMAL LOW (ref 20–29)
Calcium: 9.3 mg/dL (ref 8.7–10.2)
Chloride: 105 mmol/L (ref 96–106)
Creatinine, Ser: 0.87 mg/dL (ref 0.76–1.27)
Globulin, Total: 2.8 g/dL (ref 1.5–4.5)
Glucose: 99 mg/dL (ref 70–99)
Potassium: 4.6 mmol/L (ref 3.5–5.2)
Sodium: 137 mmol/L (ref 134–144)
Total Protein: 7.1 g/dL (ref 6.0–8.5)
eGFR: 99 mL/min/{1.73_m2} (ref >59)

## 2021-12-20 ENCOUNTER — Other Ambulatory Visit: Payer: Self-pay | Admitting: Physician Assistant

## 2021-12-20 ENCOUNTER — Encounter: Payer: Self-pay | Admitting: Physician Assistant

## 2021-12-20 DIAGNOSIS — G2581 Restless legs syndrome: Secondary | ICD-10-CM

## 2021-12-21 ENCOUNTER — Other Ambulatory Visit: Payer: Self-pay

## 2021-12-21 DIAGNOSIS — G2581 Restless legs syndrome: Secondary | ICD-10-CM

## 2021-12-21 MED ORDER — GABAPENTIN 100 MG PO CAPS: 200.0000 mg | ORAL_CAPSULE | Freq: Every day | ORAL | 0 refills | Status: DC

## 2021-12-26 ENCOUNTER — Other Ambulatory Visit: Payer: Self-pay | Admitting: Student

## 2021-12-26 DIAGNOSIS — M545 Low back pain, unspecified: Secondary | ICD-10-CM

## 2021-12-27 ENCOUNTER — Other Ambulatory Visit: Payer: Self-pay | Admitting: Physician Assistant

## 2021-12-27 DIAGNOSIS — F32A Depression, unspecified: Secondary | ICD-10-CM

## 2022-01-11 ENCOUNTER — Ambulatory Visit
Admission: RE | Admit: 2022-01-11 | Discharge: 2022-01-11 | Disposition: A | Discharge status: Home / Self Care | Payer: Federal, State, Local not specified - PPO | Source: Ambulatory Visit | Attending: Student | Admitting: Student

## 2022-01-11 DIAGNOSIS — M545 Low back pain, unspecified: Secondary | ICD-10-CM

## 2022-01-19 DIAGNOSIS — M47816 Spondylosis without myelopathy or radiculopathy, lumbar region: Secondary | ICD-10-CM | POA: Diagnosis not present

## 2022-01-19 DIAGNOSIS — Z6834 Body mass index (BMI) 34.0-34.9, adult: Secondary | ICD-10-CM | POA: Diagnosis not present

## 2022-01-21 ENCOUNTER — Other Ambulatory Visit: Payer: Self-pay | Admitting: Physician Assistant

## 2022-01-21 DIAGNOSIS — F339 Major depressive disorder, recurrent, unspecified: Secondary | ICD-10-CM

## 2022-02-08 ENCOUNTER — Other Ambulatory Visit: Payer: Self-pay | Admitting: Physician Assistant

## 2022-02-08 DIAGNOSIS — G2581 Restless legs syndrome: Secondary | ICD-10-CM

## 2022-02-16 ENCOUNTER — Other Ambulatory Visit: Payer: Self-pay | Admitting: Physician Assistant

## 2022-02-16 DIAGNOSIS — R351 Nocturia: Secondary | ICD-10-CM

## 2022-02-23 DIAGNOSIS — G8929 Other chronic pain: Secondary | ICD-10-CM | POA: Diagnosis not present

## 2022-02-23 DIAGNOSIS — M47816 Spondylosis without myelopathy or radiculopathy, lumbar region: Secondary | ICD-10-CM | POA: Diagnosis not present

## 2022-02-23 DIAGNOSIS — Z6834 Body mass index (BMI) 34.0-34.9, adult: Secondary | ICD-10-CM | POA: Diagnosis not present

## 2022-03-08 ENCOUNTER — Other Ambulatory Visit: Payer: Self-pay | Admitting: Physician Assistant

## 2022-03-08 DIAGNOSIS — K219 Gastro-esophageal reflux disease without esophagitis: Secondary | ICD-10-CM

## 2022-03-15 DIAGNOSIS — H524 Presbyopia: Secondary | ICD-10-CM | POA: Diagnosis not present

## 2022-03-15 DIAGNOSIS — H25813 Combined forms of age-related cataract, bilateral: Secondary | ICD-10-CM | POA: Diagnosis not present

## 2022-03-15 LAB — HM DIABETES EYE EXAM

## 2022-03-20 ENCOUNTER — Encounter: Payer: Self-pay | Admitting: Physician Assistant

## 2022-03-28 ENCOUNTER — Other Ambulatory Visit: Payer: Self-pay | Admitting: Physician Assistant

## 2022-03-28 DIAGNOSIS — G2581 Restless legs syndrome: Secondary | ICD-10-CM

## 2022-04-05 ENCOUNTER — Encounter: Payer: Self-pay | Admitting: Physician Assistant

## 2022-04-05 ENCOUNTER — Ambulatory Visit (INDEPENDENT_AMBULATORY_CARE_PROVIDER_SITE_OTHER): Payer: Federal, State, Local not specified - PPO | Admitting: Physician Assistant

## 2022-04-05 ENCOUNTER — Other Ambulatory Visit: Payer: Federal, State, Local not specified - PPO

## 2022-04-05 VITALS — BP 131/83 | HR 71 | Temp 97.7°F | Ht 72.0 in | Wt 251.0 lb

## 2022-04-05 DIAGNOSIS — R42 Dizziness and giddiness: Secondary | ICD-10-CM

## 2022-04-05 DIAGNOSIS — E782 Mixed hyperlipidemia: Secondary | ICD-10-CM | POA: Diagnosis not present

## 2022-04-05 DIAGNOSIS — G2581 Restless legs syndrome: Secondary | ICD-10-CM

## 2022-04-05 DIAGNOSIS — I10 Essential (primary) hypertension: Secondary | ICD-10-CM

## 2022-04-05 DIAGNOSIS — Z0000 Encounter for general adult medical examination without abnormal findings: Secondary | ICD-10-CM | POA: Diagnosis not present

## 2022-04-05 DIAGNOSIS — F339 Major depressive disorder, recurrent, unspecified: Secondary | ICD-10-CM

## 2022-04-05 MED ORDER — GABAPENTIN 300 MG PO CAPS: 300.0000 mg | ORAL_CAPSULE | Freq: Three times a day (TID) | ORAL | 0 refills | Status: DC

## 2022-04-05 NOTE — Progress Notes (Signed)
Established patient visit   Patient: Bryan Ochoa   DOB: 1960/10/06   61 y.o. Male  MRN: [id number] Visit Date: 04/05/2022  Chief Complaint  Patient presents with   Follow-up   Subjective    HPI  Patient presents for chronic follow-up visit. Patient reports dizziness with position changes especially turning his head which has been going on for 5 days.   Mood: Patient reports mood has been mostly stable. States currently working third shift which has been an adjustment. Reports medication compliance (sertraline 150 mg and Wellbutrin XL 300 mg daily).  HTN: No chest pain, palpitations, shortness of breath or lower extremity swelling. Taking Losartan 100 mg daily as directed without side effects.   HLD: Pt trying to manage with diet. Statin therapy was discontinued per hepatology request for Hep C treatment.   RLS: Reports Gabapentin 200 mg has not helped much.      12/01/2021    4:16 PM 09/01/2021    4:34 PM 05/11/2021    2:24 PM 12/09/2020    3:16 PM 10/21/2020    1:49 PM  Depression screen PHQ 2/9  Decreased Interest _0 Down, Depressed, Hopeless 0 0 1 0 0  PHQ - 2 Score _1 Altered sleeping 0 0 0 0 0  Tired, decreased energy _2 Change in appetite _3 Feeling bad or failure about yourself  1 0 1 0 0  Trouble concentrating 1 0 1 1 0  Moving slowly or fidgety/restless 0 0 0 0 0  Suicidal thoughts 0 0 0 0 0  PHQ-9 Score _4 Difficult doing work/chores Not difficult at all Somewhat difficult         12/01/2021    4:16 PM 09/01/2021    4:34 PM 05/11/2021    2:25 PM 03/05/2020   10:40 AM  GAD 7 : Generalized Anxiety Score  Nervous, Anxious, on Edge _5 Control/stop worrying _6 0  Worry too much - different things _7 0  Trouble relaxing _8 Restless _9 Easily annoyed or irritable 0 _10 Afraid - awful might happen 1 1 0 0  Total GAD 7 Score _11 Anxiety Difficulty Not difficult at all Very difficult   Somewhat difficult        Medications: Outpatient Medications Prior to Visit  Medication Sig   aspirin EC 81 MG tablet Take by mouth.   buPROPion (WELLBUTRIN XL) 300 MG 24 hr tablet TAKE 1 TABLET BY MOUTH EVERY DAY   ELDERBERRY PO Take 75 mg by mouth. 2 PO QD   losartan (COZAAR) 100 MG tablet TAKE 1 TABLET BY MOUTH EVERY DAY   Multiple Vitamin (MULTI-VITAMINS) TABS Take by mouth.   omeprazole (PRILOSEC) 20 MG capsule TAKE 1 CAPSULE BY MOUTH EVERY DAY   Rimegepant Sulfate (NURTEC) 75 MG TBDP Take 1 tablet by mouth x 1 dose for acute headache. (Max 75 mg/day)   sertraline (ZOLOFT) 100 MG tablet TAKE 1 & 1/2 TABLET BY MOUTH DAILY   tamsulosin (FLOMAX) 0.4 MG CAPS capsule TAKE 1 CAPSULE BY MOUTH EVERY DAY   [DISCONTINUED] doxycycline (VIBRA-TABS) 100 MG tablet Take 1 tablet (100 mg total) by mouth 2 (two) times daily.   [DISCONTINUED] gabapentin (NEURONTIN) 100 MG capsule TAKE 2 CAPSULES BY MOUTH AT BEDTIME  No facility-administered medications prior to visit.    Review of Systems Review of Systems:  A fourteen system review of systems was performed and found to be positive as per HPI.  Last CBC Lab Results  Component Value Date   WBC 8.4 12/01/2021   HGB 15.5 12/01/2021   HCT 47.3 12/01/2021   MCV 86 12/01/2021   MCH 28.1 12/01/2021   RDW 14.5 12/01/2021   PLT 300 97/67/3419   Last metabolic panel Lab Results  Component Value Date   GLUCOSE 99 12/15/2021   NA 137 12/15/2021   K 4.6 12/15/2021   CL 105 12/15/2021   CO2 16 (L) 12/15/2021   BUN 17 12/15/2021   CREATININE 0.87 12/15/2021   EGFR 99 12/15/2021   CALCIUM 9.3 12/15/2021   PROT 7.1 12/15/2021   ALBUMIN 4.3 12/15/2021   LABGLOB 2.8 12/15/2021   AGRATIO 1.5 12/15/2021   BILITOT <0.2 12/15/2021   ALKPHOS 111 12/15/2021   AST 12 12/15/2021   ALT 16 12/15/2021   Last lipids Lab Results  Component Value Date   CHOL 243 (H) 12/01/2021   HDL 38 (L) 12/01/2021   LDLCALC 173 (H) 12/01/2021   TRIG 172 (H)  12/01/2021   CHOLHDL 6.4 (H) 12/01/2021   Last hemoglobin A1c Lab Results  Component Value Date   HGBA1C 5.9 (H) 12/01/2021   Last thyroid functions Lab Results  Component Value Date   TSH 4.000 12/01/2021   Last vitamin D No results found for: "25OHVITD2", "25OHVITD3", "VD25OH"     Objective    BP 131/83   Pulse 71   Temp 97.7 F (36.5 C)   Ht 6' (1.829 m)   Wt 251 lb (113.9 kg)   SpO2 100%   BMI 34.04 kg/m  BP Readings from Last 3 Encounters:  04/05/22 131/83  12/01/21 125/87  09/02/21 136/82   Wt Readings from Last 3 Encounters:  04/05/22 251 lb (113.9 kg)  12/01/21 252 lb (114.3 kg)  09/01/21 254 lb (115.2 kg)    Physical Exam  General:  Pleasant and cooperative, appropriate for stated age.  Neuro:  Alert and oriented,  extra-ocular muscles intact, nystagmus with peripheral gaze noted  HEENT:  Normocephalic, atraumatic, neck supple  Skin:  no gross rash, warm, pink. Cardiac:  RRR, S1 S2 Respiratory: CTA B/L  Vascular:  Ext warm, no cyanosis apprec.; cap RF less 2 sec. Psych:  No HI/SI, judgement and insight good, Euthymic mood. Full Affect.     No results found for any visits on 04/05/22.  Assessment & Plan      Problem List Items Addressed This Visit       Cardiovascular and Mediastinum   Essential (primary) hypertension    -BP elevated on intake, repeated with improvement. Continue current medication regimen. Will continue to monitor.        Other   Depression, recurrent ([place])    -Stable. Will continue current medication regimen. Will continue to monitor.      Mixed hyperlipidemia - Primary    -Last lipid panel elevated. Statin therapy was discontinued for Hep C treatment which patient has completed, advised to follow-up with gastroenterology (Dr. Collene Mares) for recommendations if safe to resume statin therapy. Patient came in this morning for fasting labs. Will notify of results once available.       Restless legs    -No improvement with  Gabapentin 200 mg, will increase to 300 mg. Will continue to monitor.      Relevant Medications  gabapentin (NEURONTIN) 300 MG capsule   Other Visit Diagnoses     Vertigo          Vertigo: -Discussed with patient dizziness and nystagmus is consistent with vertigo. Discussed conservative therapy. Provided handout with exercises. Deferred medication therapy at this time.  Return in about 4 months (around 08/05/2022) for HTN, RLS, mood, prediabete.        Lorrene Reid, PA-C  [facility] at [facility] [phone] (phone) [phone] (fax)  [place]

## 2022-04-05 NOTE — Patient Instructions (Addendum)
How to Treat Vertigo at Home with Exercises  What is Vertigo?  Vertigo is a relatively common symptom most often associated with conditions such as sinusitis (inflammation of your sinuses due to viruses, allergies, or bacterial infections), or an inner ear infection or ear trauma.   It can be brought on by trauma (e.g. a blow to the head or whiplash) or more serious things like minor strokes.   Symptoms can also be brought on by normal degenerative changes to your inner ear that occur with aging.  The condition tends to be more commonly seen in the elderly but it can occur in all ages.    Patients most often complain of dizziness, as if the room is spinning around them.   Symptoms are provoked by quick head movements or changes in position like going from standing to lying in bed, or even turning over in bed.   It may present with nausea and/or vomiting, and can be very debilitating to some folks.    By far the most common cause, known as Benign Paroxysmal Positional Vertigo (BPPV), is categorized by a sudden onset of symptoms, that are intense but short-lived (60 seconds or less), which is triggered by a change in head position.   Symptoms usually dissipate if you stay in one position and do not move your head.   Within the inner ear are collections of calcium carbonate crystals referred to as "otoliths" which may become dislodged from their normal position and migrate into the semicircular canals of the inner ear, throwing off your body's ability to sense where you are in space.     Fig. 921 Anatomy of the Right Osseous Labyrinth. Antonieta Iba. Anatomy of the Human Body. 1918.            What Else Could Be Behind My Vertigo?  Some other causes of vertigo include:  Meniere's disease (disorder of inner ear with ringing in ears, feeling of fullness/pressure within ear, and fluctuating hearing loss) Tumours Neurological disorders e.g. Multiple Sclerosis Motion Sickness (lack of coordination  between visual stimuli, inner ear balance and positional sense) Migraine Labyrinthitis (inflammation of the fluid-filled tubes and sacs within the inner ear; may also be associated with changes in hearing) Vestibular neuritis (inflammation of the nerves associated with transmission of sensory info from the inner ear; usually of viral origins)  How it can be treated/cured? While certain medications have been prescribed for vertigo including Lorazepam your doing well 7 house the house going organizing and getting things ready for sale with the and Meclizine (for motion sickness), there exists no evidence to support a recommendation of any medication in the routine treatment of BPPV.  Clinical trials have demonstrated that repositioning techniques (listed below) are a superior option for management Otis Dials et al., 2008).    Figure above:  (A) Instructions for the modified Epley procedure (MEP) for left ear posterior canal benign paroxysmal positional vertigo (PC-BPPV). For right ear BPPV, the procedure has to be performed in the opposite direction, starting with the head turned to the right side.  1. Start by sitting on a bed with your head turned 45 to the left. Place a pillow behind you so that on lying back it will be under your shoulders.  2. Lie back quickly with shoulders on the pillow, neck extended, and head resting on the bed. In this position, the affected (left) ear is underneath. Wait for 30 secondS.  3. Turn your head 90 to the right (without raising it), and  wait again for 30 seconds.  4. Turn your body and head another 90 to the right, and wait for another 30 seconds.  5. Sit up on the right side. This maneuver should be performed three times a day. Repeat this daily until you are free from positional vertigo for 24 hours.   (B) Instructions for the modified Semont maneuver (MSM) for left ear PC-BPPV. For right ear BPPV, the maneuver has to be performed in the opposite direction,  starting with the head turned toward the left ear.  1. Sit upright on a bed with your head turned 45 toward the right ear.  2. Drop quickly to the left side, so that your head touches the bed behind your left ear. Wait 30 seconds.  3. Move head and trunk in a swift movement toward the other side without stopping in the upright position, so that your head comes to rest on the right side of your forehead. Wait again for 30 seconds.  4. Sit up again.  This maneuver should be performed three times a day. Repeat this daily until you are free from positional vertigo symptoms for 24 hours.   (   See the video in the supplementary material on the NeurologyWeb site; go to http://www.neurology.org/content/63/1/150/F1.expansion.html   )     You can also try this motion at home as well- Self-Treatment of Benign Paroxysmal Positional Vertigo Benign Paroxysmal Positioning Vertigo is caused by loose inner ear crystals in the inner ear that migrate while sleeping to the back-bottom inner ear balance canal, the so-called "posterior semi-circular canal." The maneuver demonstrated below is the way to reposition the loose crystals so that the symptoms caused by the loose crystals go away. You may have a floating, swaying sense while walking or sitting for a few days after this procedure.             Restless Legs Syndrome Restless legs syndrome is a condition that causes uncomfortable feelings or sensations in the legs, especially while sitting or lying down. The sensations usually cause an overwhelming urge to move the legs. The arms can also sometimes be affected. The condition can range from mild to severe. The symptoms often interfere with a person's ability to sleep. What are the causes? The cause of this condition is not known. What increases the risk? The following factors may make you more likely to develop this condition: Being older than 50. Pregnancy. Being a woman. In general, the  condition is more common in women than in men. A family history of the condition. Having iron deficiency. Overuse of caffeine, nicotine, or alcohol. Certain medical conditions, such as kidney disease, Parkinson's disease, or nerve damage. Certain medicines, such as those for high blood pressure, nausea, colds, allergies, depression, and some heart conditions. What are the signs or symptoms? The main symptom of this condition is uncomfortable sensations in the legs, such as: Pulling. Tingling. Prickling. Throbbing. Crawling. Burning. Usually, the sensations: Affect both sides of the body. Are worse when you sit or lie down. Are worse at night. These may make it difficult to fall asleep. Make you have a strong urge to move your legs. Are temporarily relieved by moving your legs or standing. The arms can also be affected, but this is rare. People who have this condition often have tiredness during the day because of their lack of sleep at night. How is this diagnosed? This condition may be diagnosed based on: Your symptoms. Blood tests. In some cases, you may be  monitored in a sleep lab by a specialist (a sleep study). This can detect any disruptions in your sleep. How is this treated? This condition is treated by managing the symptoms. This may include: Lifestyle changes, such as exercising, using relaxation techniques, and avoiding caffeine, alcohol, or tobacco. Iron supplements. Medicines. Parkinson's medications may be tried first. Anti-seizure medications can also be helpful. Follow these instructions at home: General instructions Take over-the-counter and prescription medicines only as told by your health care provider. Use methods to help relieve the uncomfortable sensations, such as: Massaging your legs. Walking or stretching. Taking a cold or hot bath. Keep all follow-up visits. This is important. Lifestyle     Practice good sleep habits. For example, go to bed and  get up at the same time every day. Most adults should get 7-9 hours of sleep each night. Exercise regularly. Try to get at least 30 minutes of exercise most days of the week. Practice ways of relaxing, such as yoga or meditation. Avoid caffeine and alcohol. Do not use any products that contain nicotine or tobacco. These products include cigarettes, chewing tobacco, and vaping devices, such as e-cigarettes. If you need help quitting, ask your health care provider. Where to find more information [employer] of Neurological Disorders and Stroke: [employer] Contact a health care provider if: Your symptoms get worse or they do not improve with treatment. Summary Restless legs syndrome is a condition that causes uncomfortable feelings or sensations in the legs, especially while sitting or lying down. The symptoms often interfere with your ability to sleep. This condition is treated by managing the symptoms. You may need to make lifestyle changes or take medicines. This information is not intended to replace advice given to you by your health care provider. Make sure you discuss any questions you have with your health care provider. Document Revised: 03/06/2021 Document Reviewed: 03/06/2021 Elsevier Patient Education  2023 [employer].

## 2022-04-05 NOTE — Assessment & Plan Note (Signed)
-  Stable. Will continue current medication regimen. Will continue to monitor. 

## 2022-04-05 NOTE — Assessment & Plan Note (Signed)
-  Last lipid panel elevated. Statin therapy was discontinued for Hep C treatment which patient has completed, advised to follow-up with gastroenterology (Dr. Loreta Ave) for recommendations if safe to resume statin therapy. Patient came in this morning for fasting labs. Will notify of results once available.

## 2022-04-05 NOTE — Assessment & Plan Note (Signed)
-  No improvement with Gabapentin 200 mg, will increase to 300 mg. Will continue to monitor.

## 2022-04-05 NOTE — Assessment & Plan Note (Signed)
-  BP elevated on intake, repeated with improvement. Continue current medication regimen. Will continue to monitor. 

## 2022-04-06 LAB — COMPREHENSIVE METABOLIC PANEL
ALT: 16 IU/L (ref 0–44)
AST: 22 IU/L (ref 0–40)
Albumin/Globulin Ratio: 2.1 (ref 1.2–2.2)
Albumin: 4.8 g/dL (ref 3.8–4.9)
Alkaline Phosphatase: 70 IU/L (ref 44–121)
BUN/Creatinine Ratio: 15 (ref 9–20)
BUN: 15 mg/dL (ref 6–24)
Bilirubin Total: 0.4 mg/dL (ref 0.0–1.2)
CO2: 18 mmol/L — ABNORMAL LOW (ref 20–29)
Calcium: 9.4 mg/dL (ref 8.7–10.2)
Chloride: 102 mmol/L (ref 96–106)
Creatinine, Ser: 0.97 mg/dL (ref 0.76–1.27)
Globulin, Total: 2.3 g/dL (ref 1.5–4.5)
Glucose: 86 mg/dL (ref 70–99)
Potassium: 4 mmol/L (ref 3.5–5.2)
Sodium: 136 mmol/L (ref 134–144)
Total Protein: 7.1 g/dL (ref 6.0–8.5)
eGFR: 90 mL/min/{1.73_m2} (ref >59)

## 2022-04-06 LAB — LIPID PANEL
Chol/HDL Ratio: 4.8 ratio (ref 0.0–5.0)
Cholesterol, Total: 174 mg/dL (ref 100–199)
HDL: 36 mg/dL — ABNORMAL LOW (ref >39)
LDL Chol Calc (NIH): 112 mg/dL — ABNORMAL HIGH (ref 0–99)
Triglycerides: 145 mg/dL (ref 0–149)
VLDL Cholesterol Cal: 26 mg/dL (ref 5–40)

## 2022-04-06 MED ORDER — GABAPENTIN 300 MG PO CAPS: 300.0000 mg | ORAL_CAPSULE | Freq: Every day | ORAL | 0 refills | Status: DC

## 2022-04-06 NOTE — Addendum Note (Signed)
Addended by: Mayer Masker on: 04/06/2022 11:04 AM   Modules accepted: Orders

## 2022-04-08 ENCOUNTER — Other Ambulatory Visit: Payer: Self-pay | Admitting: Physician Assistant

## 2022-04-24 ENCOUNTER — Other Ambulatory Visit: Payer: Self-pay | Admitting: Physician Assistant

## 2022-04-24 DIAGNOSIS — F339 Major depressive disorder, recurrent, unspecified: Secondary | ICD-10-CM

## 2022-04-24 DIAGNOSIS — F32A Depression, unspecified: Secondary | ICD-10-CM

## 2022-06-12 ENCOUNTER — Other Ambulatory Visit: Payer: Self-pay | Admitting: Physician Assistant

## 2022-06-12 DIAGNOSIS — K219 Gastro-esophageal reflux disease without esophagitis: Secondary | ICD-10-CM

## 2022-06-12 NOTE — Telephone Encounter (Signed)
Needs an appointment.

## 2022-06-21 ENCOUNTER — Ambulatory Visit: Payer: Self-pay | Admitting: Licensed Clinical Social Worker

## 2022-06-21 NOTE — Patient Outreach (Signed)
  Care Coordination   Initial Visit Note   06/21/2022 Name: Bryan Ochoa MRN: [id number] DOB: Sep 17, 1960  Bryan Ochoa is a 61 y.o. year old male who sees Mayer Masker, [place] for primary care. I spoke with  Bryan Ochoa by phone today.  What matters to the patients health and wellness today?  Patient denied services    Goals Addressed               This Visit's Progress     Care Coordination- Denied Services (pt-stated)        Patient stated no services needed at this time. SW completed SDOH screening.  Patient stated no additional needs.         SDOH assessments and interventions completed:  Yes     Care Coordination Interventions Activated:  Yes  Care Coordination Interventions:  Yes, provided   Follow up plan: No further intervention required.   Encounter Outcome:  Pt. Visit Completed

## 2022-06-21 NOTE — Patient Instructions (Signed)
Visit Information  Thank you for taking time to visit with me today. Please don't hesitate to contact me if I can be of assistance to you.   Following are the goals we discussed today:   Goals Addressed               This Visit's Progress     Care Coordination- Denied Services (pt-stated)        Patient stated no services needed at this time. SW completed SDOH screening.  Patient stated no additional needs.           Patient verbalizes understanding of instructions and care plan provided today and agrees to view in MyChart. Active MyChart status and patient understanding of how to access instructions and care plan via MyChart confirmed with patient.     No further follow up required: .  Christen Butter, Kenard Gower, MSW, LCSW-A  Social Worker [facility]  [phone]

## 2022-06-25 ENCOUNTER — Other Ambulatory Visit: Payer: Self-pay | Admitting: Physician Assistant

## 2022-06-25 DIAGNOSIS — K219 Gastro-esophageal reflux disease without esophagitis: Secondary | ICD-10-CM

## 2022-06-26 NOTE — Telephone Encounter (Signed)
Office visit needed for future refill

## 2022-07-18 ENCOUNTER — Encounter: Payer: Self-pay | Admitting: Nurse Practitioner

## 2022-07-18 ENCOUNTER — Ambulatory Visit: Payer: Federal, State, Local not specified - PPO | Admitting: Nurse Practitioner

## 2022-07-18 ENCOUNTER — Telehealth: Payer: Self-pay | Admitting: *Deleted

## 2022-07-18 NOTE — Telephone Encounter (Signed)
LVM requesting an earlier appointment. I called and LVM for patient to call back to see what we may have available. If pt calls back please assist in getting an appointment scheduled. Tashonda Pinkus Zimmerman Rumple, CMA

## 2022-07-20 ENCOUNTER — Telehealth: Payer: Self-pay | Admitting: *Deleted

## 2022-07-20 NOTE — Telephone Encounter (Signed)
Pt wife calling to see about getting an earlier appointment for patient and there is nothing available.  I placed pt on wait list. Casady Voshell Lamonte Sakai, CMA

## 2022-07-27 DIAGNOSIS — K080 Exfoliation of teeth due to systemic causes: Secondary | ICD-10-CM | POA: Diagnosis not present

## 2022-08-07 NOTE — Progress Notes (Signed)
Established patient visit   Patient: Bryan Ochoa   DOB: 1961/03/10   62 y.o. Male  MRN: [id number] Visit Date: 08/08/2022   Chief Complaint  Patient presents with   Follow-up   Hypertension    D/C meds 3 months ago   Subjective    HPI HPI     Hypertension    Additional comments: D/C meds 3 months ago      Last edited by Gemma Payor, CMA on 08/08/2022 11:28 AM.      Follow up  -hypertension  --generally well managed.  ---stopped taking all of his medication about 3 months ago. --blood pressure very elevated today. --increased severity and frequency of migraine headaches . -RLS --taking gabapentin at bedtime -vertigo  -currently being treated for hepatitis C  -? Flu vaccine if no longer being treated for hepatitis C   Medications: Outpatient Medications Prior to Visit  Medication Sig   aspirin EC 81 MG tablet Take by mouth. (Patient not taking: Reported on 08/08/2022)   ELDERBERRY PO Take 75 mg by mouth. 2 PO QD (Patient not taking: Reported on 08/08/2022)   Multiple Vitamin (MULTI-VITAMINS) TABS Take by mouth. (Patient not taking: Reported on 08/08/2022)   omeprazole (PRILOSEC) 20 MG capsule TAKE 1 CAPSULE BY MOUTH EVERY DAY (Patient not taking: Reported on 08/08/2022)   Rimegepant Sulfate (NURTEC) 75 MG TBDP Take 1 tablet by mouth x 1 dose for acute headache. (Max 75 mg/day) (Patient not taking: Reported on 08/08/2022)   [DISCONTINUED] buPROPion (WELLBUTRIN XL) 300 MG 24 hr tablet TAKE 1 TABLET BY MOUTH EVERY DAY (Patient not taking: Reported on 08/08/2022)   [DISCONTINUED] gabapentin (NEURONTIN) 300 MG capsule Take 1 capsule (300 mg total) by mouth at bedtime. (Patient not taking: Reported on 08/08/2022)   [DISCONTINUED] losartan (COZAAR) 100 MG tablet TAKE 1 TABLET BY MOUTH EVERY DAY (Patient not taking: Reported on 08/08/2022)   [DISCONTINUED] sertraline (ZOLOFT) 100 MG tablet TAKE 1 & 1/2 TABLETS BY MOUTH DAILY (Patient not taking: Reported on 08/08/2022)   [DISCONTINUED]  tamsulosin (FLOMAX) 0.4 MG CAPS capsule TAKE 1 CAPSULE BY MOUTH EVERY DAY (Patient not taking: Reported on 08/08/2022)   No facility-administered medications prior to visit.    Review of Systems  Constitutional:  Positive for fatigue. Negative for activity change, chills and fever.  HENT:  Negative for congestion, postnasal drip, rhinorrhea, sinus pressure, sinus pain, sneezing and sore throat.   Eyes: Negative.   Respiratory:  Negative for cough, shortness of breath and wheezing.   Cardiovascular:  Negative for chest pain and palpitations.       Blood pressure very elevated today as he has been off all medications for past few months   Gastrointestinal:  Negative for constipation, diarrhea, nausea and vomiting.  Endocrine: Negative for cold intolerance, heat intolerance, polydipsia and polyuria.  Genitourinary:  Negative for dysuria, frequency and urgency.  Musculoskeletal:  Negative for back pain and myalgias.  Skin:  Negative for rash.  Allergic/Immunologic: Negative for environmental allergies.  Neurological:  Negative for dizziness, weakness and headaches.  Psychiatric/Behavioral:  Positive for dysphoric mood. The patient is nervous/anxious.     Last CBC Lab Results  Component Value Date   WBC 8.4 12/01/2021   HGB 15.5 12/01/2021   HCT 47.3 12/01/2021   MCV 86 12/01/2021   MCH 28.1 12/01/2021   RDW 14.5 12/01/2021   PLT 300 40/05/2724   Last metabolic panel Lab Results  Component Value Date   GLUCOSE 109 (H) 08/08/2022   NA  141 08/08/2022   K 4.8 08/08/2022   CL 105 08/08/2022   CO2 20 08/08/2022   BUN 12 08/08/2022   CREATININE 0.92 08/08/2022   EGFR 96 08/08/2022   CALCIUM 9.6 08/08/2022   PROT 7.1 08/08/2022   ALBUMIN 4.5 08/08/2022   LABGLOB 2.6 08/08/2022   AGRATIO 1.7 08/08/2022   BILITOT 0.2 08/08/2022   ALKPHOS 88 08/08/2022   AST 14 08/08/2022   ALT 11 08/08/2022   Last lipids Lab Results  Component Value Date   CHOL 174 04/05/2022   HDL 36 (L)  04/05/2022   LDLCALC 112 (H) 04/05/2022   TRIG 145 04/05/2022   CHOLHDL 4.8 04/05/2022   Last hemoglobin A1c Lab Results  Component Value Date   HGBA1C 5.6 08/08/2022   Last thyroid functions Lab Results  Component Value Date   TSH 4.000 12/01/2021       Objective     Today's Vitals   08/08/22 1128 08/08/22 1200  BP: (Abnormal) 176/104 (Abnormal) 159/96  Pulse: 85   Resp: 18   SpO2: 96%   Weight: 250 lb (113.4 kg)   Height: 6' (1.829 m)    Body mass index is 33.91 kg/m.  BP Readings from Last 3 Encounters:  08/08/22 (Abnormal) 159/96  04/05/22 131/83  12/01/21 125/87    Wt Readings from Last 3 Encounters:  08/08/22 250 lb (113.4 kg)  04/05/22 251 lb (113.9 kg)  12/01/21 252 lb (114.3 kg)    Physical Exam Vitals and nursing note reviewed.  Constitutional:      Appearance: Normal appearance. He is well-developed.  HENT:     Head: Normocephalic and atraumatic.     Nose: Nose normal.     Mouth/Throat:     Mouth: Mucous membranes are moist.     Pharynx: Oropharynx is clear.  Eyes:     Extraocular Movements: Extraocular movements intact.     Conjunctiva/sclera: Conjunctivae normal.     Pupils: Pupils are equal, round, and reactive to light.  Cardiovascular:     Rate and Rhythm: Normal rate and regular rhythm.     Pulses: Normal pulses.     Heart sounds: Normal heart sounds.  Pulmonary:     Effort: Pulmonary effort is normal.     Breath sounds: Normal breath sounds.  Abdominal:     Palpations: Abdomen is soft.  Musculoskeletal:        General: Normal range of motion.     Cervical back: Normal range of motion and neck supple.  Lymphadenopathy:     Cervical: No cervical adenopathy.  Skin:    General: Skin is warm and dry.     Capillary Refill: Capillary refill takes less than 2 seconds.  Neurological:     General: No focal deficit present.     Mental Status: He is alert and oriented to person, place, and time.  Psychiatric:        Attention and  Perception: Attention and perception normal.        Mood and Affect: Mood is anxious and depressed.        Speech: Speech normal.        Behavior: Behavior normal. Behavior is cooperative.        Thought Content: Thought content normal.        Cognition and Memory: Cognition and memory normal.        Judgment: Judgment normal.      Results for orders placed or performed in visit on 08/08/22  Comp Met (CMET)  Result Value Ref Range   Glucose 109 (H) 70 - 99 mg/dL   BUN 12 6 - 24 mg/dL   Creatinine, Ser 0.92 0.76 - 1.27 mg/dL   eGFR 96 >59 mL/min/1.73   BUN/Creatinine Ratio 13 9 - 20   Sodium 141 134 - 144 mmol/L   Potassium 4.8 3.5 - 5.2 mmol/L   Chloride 105 96 - 106 mmol/L   CO2 20 20 - 29 mmol/L   Calcium 9.6 8.7 - 10.2 mg/dL   Total Protein 7.1 6.0 - 8.5 g/dL   Albumin 4.5 3.8 - 4.9 g/dL   Globulin, Total 2.6 1.5 - 4.5 g/dL   Albumin/Globulin Ratio 1.7 1.2 - 2.2   Bilirubin Total 0.2 0.0 - 1.2 mg/dL   Alkaline Phosphatase 88 44 - 121 IU/L   AST 14 0 - 40 IU/L   ALT 11 0 - 44 IU/L  Hepatitis C Antibody  Result Value Ref Range   Hep C Virus Ab Reactive (A) Non Reactive  POCT HgB A1C  Result Value Ref Range   Hemoglobin A1C     HbA1c POC (<> result, manual entry) 5.6 4.0 - 5.6 %   HbA1c, POC (prediabetic range)     HbA1c, POC (controlled diabetic range)      Assessment & Plan    1. Essential (primary) hypertension Restart losartan 100 mg daily. Reviewed importance of following a DASH diet and drinking plenty of water. Recheck in 3 weeks  - losartan (COZAAR) 100 MG tablet; Take 1 tablet (100 mg total) by mouth daily.  Dispense: 90 tablet; Refill: 1 - Comp Met (CMET); Future - Comp Met (CMET)  2. Prediabetes Cgeck CMP and HgbA1c today  - POCT HgB A1C  3. Restless legs Restart gabapentin 300 mg at bedtime. Recheck in 3 weeks  - gabapentin (NEURONTIN) 300 MG capsule; Take 1 capsule (300 mg total) by mouth at bedtime.  Dispense: 90 capsule; Refill: 1  4.  Depression, recurrent ([place]) Restart both wellbutrin XL 150 mg and sertraline 100 mg daily. Reassess depression in 3 weeks.  - buPROPion (WELLBUTRIN XL) 150 MG 24 hr tablet; Take 1 tablet (150 mg total) by mouth daily.  Dispense: 30 tablet; Refill: 2 - sertraline (ZOLOFT) 100 MG tablet; Take 1 tablet (100 mg total) by mouth daily.  Dispense: 90 tablet; Refill: 1   5. Nocturia Restart flomax 0.4 mg at bedtime.  - tamsulosin (FLOMAX) 0.4 MG CAPS capsule; Take 1 capsule (0.4 mg total) by mouth daily.  Dispense: 90 capsule; Refill: 1  6. Need for hepatitis C screening test Check labs for hepatitis C.  - Hepatitis C Antibody; Future - Hepatitis C Antibody   Problem List Items Addressed This Visit       Cardiovascular and Mediastinum   Essential (primary) hypertension - Primary   Relevant Medications   losartan (COZAAR) 100 MG tablet   Other Relevant Orders   Comp Met (CMET) (Completed)     Other   Depression, recurrent (HCC)   Relevant Medications   buPROPion (WELLBUTRIN XL) 150 MG 24 hr tablet   sertraline (ZOLOFT) 100 MG tablet   Nocturia   Relevant Medications   tamsulosin (FLOMAX) 0.4 MG CAPS capsule   Restless legs   Relevant Medications   gabapentin (NEURONTIN) 300 MG capsule   Prediabetes   Relevant Orders   POCT HgB A1C (Completed)   Other Visit Diagnoses     Need for hepatitis C screening test       Relevant Orders  Hepatitis C Antibody (Completed)   Depression, unspecified depression type       Relevant Medications   buPROPion (WELLBUTRIN XL) 150 MG 24 hr tablet   sertraline (ZOLOFT) 100 MG tablet        Return in about 3 weeks (around 08/29/2022) for blood pressure, mood .         Ronnell Freshwater, NP  [facility] [phone] (phone) [phone] (fax)  [place]

## 2022-08-08 ENCOUNTER — Encounter: Payer: Self-pay | Admitting: Nurse Practitioner

## 2022-08-08 ENCOUNTER — Ambulatory Visit: Payer: Federal, State, Local not specified - PPO | Admitting: Nurse Practitioner

## 2022-08-08 VITALS — BP 159/96 | HR 85 | Resp 18 | Ht 72.0 in | Wt 250.0 lb

## 2022-08-08 DIAGNOSIS — I10 Essential (primary) hypertension: Secondary | ICD-10-CM | POA: Diagnosis not present

## 2022-08-08 DIAGNOSIS — Z1159 Encounter for screening for other viral diseases: Secondary | ICD-10-CM

## 2022-08-08 DIAGNOSIS — F32A Depression, unspecified: Secondary | ICD-10-CM

## 2022-08-08 DIAGNOSIS — R351 Nocturia: Secondary | ICD-10-CM

## 2022-08-08 DIAGNOSIS — R7303 Prediabetes: Secondary | ICD-10-CM | POA: Diagnosis not present

## 2022-08-08 DIAGNOSIS — G2581 Restless legs syndrome: Secondary | ICD-10-CM

## 2022-08-08 DIAGNOSIS — F339 Major depressive disorder, recurrent, unspecified: Secondary | ICD-10-CM | POA: Diagnosis not present

## 2022-08-08 LAB — POCT GLYCOSYLATED HEMOGLOBIN (HGB A1C): HbA1c POC (<> result, manual entry): 5.6 % (ref 4.0–5.6)

## 2022-08-08 MED ORDER — TAMSULOSIN HCL 0.4 MG PO CAPS: 0.4000 mg | ORAL_CAPSULE | Freq: Every day | ORAL | 1 refills | Status: DC

## 2022-08-08 MED ORDER — GABAPENTIN 300 MG PO CAPS: 300.0000 mg | ORAL_CAPSULE | Freq: Every day | ORAL | 1 refills | Status: DC

## 2022-08-08 MED ORDER — BUPROPION HCL ER (XL) 150 MG PO TB24: 150.0000 mg | ORAL_TABLET | Freq: Every day | ORAL | 2 refills | Status: DC

## 2022-08-08 MED ORDER — SERTRALINE HCL 100 MG PO TABS: 100.0000 mg | ORAL_TABLET | Freq: Every day | ORAL | 1 refills | Status: DC

## 2022-08-08 MED ORDER — LOSARTAN POTASSIUM 100 MG PO TABS: 100.0000 mg | ORAL_TABLET | Freq: Every day | ORAL | 1 refills | Status: DC

## 2022-08-09 LAB — COMPREHENSIVE METABOLIC PANEL
ALT: 11 IU/L (ref 0–44)
AST: 14 IU/L (ref 0–40)
Albumin/Globulin Ratio: 1.7 (ref 1.2–2.2)
Albumin: 4.5 g/dL (ref 3.8–4.9)
Alkaline Phosphatase: 88 IU/L (ref 44–121)
BUN/Creatinine Ratio: 13 (ref 9–20)
BUN: 12 mg/dL (ref 6–24)
Bilirubin Total: 0.2 mg/dL (ref 0.0–1.2)
CO2: 20 mmol/L (ref 20–29)
Calcium: 9.6 mg/dL (ref 8.7–10.2)
Chloride: 105 mmol/L (ref 96–106)
Creatinine, Ser: 0.92 mg/dL (ref 0.76–1.27)
Globulin, Total: 2.6 g/dL (ref 1.5–4.5)
Glucose: 109 mg/dL — ABNORMAL HIGH (ref 70–99)
Potassium: 4.8 mmol/L (ref 3.5–5.2)
Sodium: 141 mmol/L (ref 134–144)
Total Protein: 7.1 g/dL (ref 6.0–8.5)
eGFR: 96 mL/min/{1.73_m2} (ref >59)

## 2022-08-09 LAB — HEPATITIS C ANTIBODY: Hep C Virus Ab: REACTIVE — AB

## 2022-08-24 ENCOUNTER — Other Ambulatory Visit (HOSPITAL_BASED_OUTPATIENT_CLINIC_OR_DEPARTMENT_OTHER): Payer: Self-pay

## 2022-08-30 ENCOUNTER — Ambulatory Visit: Payer: Federal, State, Local not specified - PPO | Admitting: Nurse Practitioner

## 2022-08-30 ENCOUNTER — Encounter: Payer: Self-pay | Admitting: Nurse Practitioner

## 2022-08-30 VITALS — BP 125/93 | HR 84 | Resp 18 | Ht 72.0 in | Wt 236.0 lb

## 2022-08-30 DIAGNOSIS — Z8619 Personal history of other infectious and parasitic diseases: Secondary | ICD-10-CM | POA: Diagnosis not present

## 2022-08-30 DIAGNOSIS — B182 Chronic viral hepatitis C: Secondary | ICD-10-CM | POA: Diagnosis not present

## 2022-08-30 DIAGNOSIS — F339 Major depressive disorder, recurrent, unspecified: Secondary | ICD-10-CM

## 2022-08-30 DIAGNOSIS — Z1159 Encounter for screening for other viral diseases: Secondary | ICD-10-CM | POA: Diagnosis not present

## 2022-08-30 DIAGNOSIS — R768 Other specified abnormal immunological findings in serum: Secondary | ICD-10-CM | POA: Diagnosis not present

## 2022-08-30 DIAGNOSIS — I10 Essential (primary) hypertension: Secondary | ICD-10-CM | POA: Diagnosis not present

## 2022-08-30 DIAGNOSIS — R7303 Prediabetes: Secondary | ICD-10-CM | POA: Insufficient documentation

## 2022-08-30 NOTE — Progress Notes (Signed)
Established patient visit   Patient: Bryan Ochoa   DOB: 07-01-1961   62 y.o. Male  MRN: [id number] Visit Date: 08/30/2022   Chief Complaint  Patient presents with   Follow-up   Hypertension   mood   Subjective    HPI  Follow up  -hypertension  --restarted losartan  -anxiety --restarted both sertraline and Wellbutrin XL -history of hepatitis C -  --labs indicate reactive antibodies --check viral load.  --refer to GI if indicated    Medications: Outpatient Medications Prior to Visit  Medication Sig   aspirin EC 81 MG tablet Take by mouth. (Patient not taking: Reported on 08/08/2022)   ELDERBERRY PO Take 75 mg by mouth. 2 PO QD (Patient not taking: Reported on 08/08/2022)   gabapentin (NEURONTIN) 300 MG capsule Take 1 capsule (300 mg total) by mouth at bedtime.   losartan (COZAAR) 100 MG tablet Take 1 tablet (100 mg total) by mouth daily.   Multiple Vitamin (MULTI-VITAMINS) TABS Take by mouth. (Patient not taking: Reported on 08/08/2022)   omeprazole (PRILOSEC) 20 MG capsule TAKE 1 CAPSULE BY MOUTH EVERY DAY (Patient not taking: Reported on 08/08/2022)   Rimegepant Sulfate (NURTEC) 75 MG TBDP Take 1 tablet by mouth x 1 dose for acute headache. (Max 75 mg/day) (Patient not taking: Reported on 08/08/2022)   sertraline (ZOLOFT) 100 MG tablet Take 1 tablet (100 mg total) by mouth daily.   tamsulosin (FLOMAX) 0.4 MG CAPS capsule Take 1 capsule (0.4 mg total) by mouth daily.   [DISCONTINUED] buPROPion (WELLBUTRIN XL) 150 MG 24 hr tablet Take 1 tablet (150 mg total) by mouth daily.   No facility-administered medications prior to visit.    Review of Systems  Constitutional:  Negative for activity change, chills, fatigue and fever.  HENT:  Negative for congestion, postnasal drip, rhinorrhea, sinus pressure, sinus pain, sneezing and sore throat.   Eyes: Negative.   Respiratory:  Negative for cough, shortness of breath and wheezing.   Cardiovascular:  Negative for chest pain and  palpitations.  Gastrointestinal:  Negative for constipation, diarrhea, nausea and vomiting.  Endocrine: Negative for cold intolerance, heat intolerance, polydipsia and polyuria.  Genitourinary:  Negative for dysuria, frequency and urgency.  Musculoskeletal:  Negative for back pain and myalgias.  Skin:  Negative for rash.  Allergic/Immunologic: Negative for environmental allergies.  Neurological:  Negative for dizziness, weakness and headaches.  Psychiatric/Behavioral:  Positive for dysphoric mood. The patient is nervous/anxious.        Improved since starting back antidepressants.     Last CBC Lab Results  Component Value Date   WBC 8.4 12/01/2021   HGB 15.5 12/01/2021   HCT 47.3 12/01/2021   MCV 86 12/01/2021   MCH 28.1 12/01/2021   RDW 14.5 12/01/2021   PLT 300 [id number]   Last metabolic panel Lab Results  Component Value Date   GLUCOSE 109 (H) 08/08/2022   NA 141 08/08/2022   K 4.8 08/08/2022   CL 105 08/08/2022   CO2 20 08/08/2022   BUN 12 08/08/2022   CREATININE 0.92 08/08/2022   EGFR 96 08/08/2022   CALCIUM 9.6 08/08/2022   PROT 7.1 08/08/2022   ALBUMIN 4.5 08/08/2022   LABGLOB 2.6 08/08/2022   AGRATIO 1.7 08/08/2022   BILITOT 0.2 08/08/2022   ALKPHOS 88 08/08/2022   AST 14 08/08/2022   ALT 11 08/08/2022   Last lipids Lab Results  Component Value Date   CHOL 174 04/05/2022   HDL 36 (L) 04/05/2022   LDLCALC 112 (H)  04/05/2022   TRIG 145 04/05/2022   CHOLHDL 4.8 04/05/2022   Last hemoglobin A1c Lab Results  Component Value Date   HGBA1C 5.6 08/08/2022   Last thyroid functions Lab Results  Component Value Date   TSH 4.000 12/01/2021      Objective     Today's Vitals   08/30/22 1112  BP: (Abnormal) 125/93  Pulse: 84  Resp: 18  SpO2: 97%  Weight: 236 lb (107 kg)  Height: 6' (1.829 m)   Body mass index is 32.01 kg/m.  BP Readings from Last 3 Encounters:  08/30/22 (Abnormal) 125/93  08/08/22 (Abnormal) 159/96  04/05/22 131/83    Wt  Readings from Last 3 Encounters:  08/30/22 236 lb (107 kg)  08/08/22 250 lb (113.4 kg)  04/05/22 251 lb (113.9 kg)    Physical Exam Vitals and nursing note reviewed.  Constitutional:      Appearance: Normal appearance. He is well-developed.  HENT:     Head: Normocephalic and atraumatic.     Nose: Nose normal.     Mouth/Throat:     Mouth: Mucous membranes are moist.     Pharynx: Oropharynx is clear.  Eyes:     Extraocular Movements: Extraocular movements intact.     Conjunctiva/sclera: Conjunctivae normal.     Pupils: Pupils are equal, round, and reactive to light.  Cardiovascular:     Rate and Rhythm: Normal rate and regular rhythm.     Pulses: Normal pulses.     Heart sounds: Normal heart sounds.  Pulmonary:     Effort: Pulmonary effort is normal.     Breath sounds: Normal breath sounds.  Abdominal:     Palpations: Abdomen is soft.  Musculoskeletal:        General: Normal range of motion.     Cervical back: Normal range of motion and neck supple.  Lymphadenopathy:     Cervical: No cervical adenopathy.  Skin:    General: Skin is warm and dry.     Capillary Refill: Capillary refill takes less than 2 seconds.  Neurological:     General: No focal deficit present.     Mental Status: He is alert and oriented to person, place, and time.  Psychiatric:        Attention and Perception: Attention and perception normal.        Mood and Affect: Mood is anxious and depressed.        Speech: Speech normal.        Behavior: Behavior normal. Behavior is cooperative.        Thought Content: Thought content normal.        Cognition and Memory: Cognition and memory normal.        Judgment: Judgment normal.      Results for orders placed or performed in visit on 08/30/22  HCV RT-PCR, Quant (Graph)  Result Value Ref Range   Hepatitis C Quantitation HCV Not Detected IU/mL   Test Information Comment   Hepatitis C Genotype  Result Value Ref Range   Hepatitis C Genotype CANCELED     Please Note (HCV): Comment     Assessment & Plan    1. Essential (primary) hypertension Improved. Continue losartan as prescribed   2. Depression, recurrent ([place]) Improved and stable back on sertraline and wellbutrin.   3. History of hepatitis C History of hepatitis C with completion of treatment in past.   4. Positive hepatitis C antibody test Likely from history of positive hepatitis C. Will check viral load  and send to GI as indicated.  - HCV RT-PCR, Quant (Graph) - Hepatitis C Genotype   Problem List Items Addressed This Visit       Cardiovascular and Mediastinum   Essential (primary) hypertension - Primary     Other   Depression, recurrent ([place])   Other Visit Diagnoses     History of hepatitis C       Positive hepatitis C antibody test       Relevant Orders   HCV RT-PCR, Quant (Graph) (Completed)   Hepatitis C Genotype (Completed)        Return in about 3 months (around 11/29/2022) for health maintenance exam.         Ronnell Freshwater, NP  [place] at [facility] [phone] (phone) [phone] (fax)  [place]

## 2022-09-02 ENCOUNTER — Other Ambulatory Visit: Payer: Self-pay | Admitting: Nurse Practitioner

## 2022-09-02 DIAGNOSIS — F339 Major depressive disorder, recurrent, unspecified: Secondary | ICD-10-CM

## 2022-09-02 LAB — HEPATITIS C GENOTYPE

## 2022-09-02 LAB — HCV RT-PCR, QUANT (GRAPH): Hepatitis C Quantitation: NOT DETECTED IU/mL

## 2022-10-24 ENCOUNTER — Telehealth: Payer: Federal, State, Local not specified - PPO | Admitting: Family Medicine

## 2022-10-24 DIAGNOSIS — J019 Acute sinusitis, unspecified: Secondary | ICD-10-CM | POA: Diagnosis not present

## 2022-10-24 DIAGNOSIS — B9689 Other specified bacterial agents as the cause of diseases classified elsewhere: Secondary | ICD-10-CM

## 2022-10-24 MED ORDER — DOXYCYCLINE HYCLATE 100 MG PO TABS: 100.0000 mg | ORAL_TABLET | Freq: Two times a day (BID) | ORAL | 0 refills | Status: AC

## 2022-10-24 MED ORDER — FLUTICASONE PROPIONATE 50 MCG/ACT NA SUSP: 2.0000 | Freq: Every day | NASAL | 0 refills | Status: DC

## 2022-10-24 MED ORDER — LORATADINE 10 MG PO TABS: 10.0000 mg | ORAL_TABLET | Freq: Every day | ORAL | 0 refills | Status: DC

## 2022-10-24 NOTE — Patient Instructions (Signed)
Bryan Ochoa, thank you for joining Perlie Mayo, NP for today's virtual visit.  While this provider is not your primary care provider (PCP), if your PCP is located in our provider database this encounter information will be shared with them immediately following your visit.   [facility] account gives you access to today's visit and all your visits, tests, and labs performed at [facility] " click here if you don't have a [facility] account or go to [email or web address]  Consent: (Patient) Bryan Ochoa provided verbal consent for this virtual visit at the beginning of the encounter.  Current Medications:  Current Outpatient Medications:    doxycycline (VIBRA-TABS) 100 MG tablet, Take 1 tablet (100 mg total) by mouth 2 (two) times daily for 10 days., Disp: 20 tablet, Rfl: 0   fluticasone (FLONASE) 50 MCG/ACT nasal spray, Place 2 sprays into both nostrils daily., Disp: 16 g, Rfl: 0   loratadine (CLARITIN) 10 MG tablet, Take 1 tablet (10 mg total) by mouth daily., Disp: 30 tablet, Rfl: 0   aspirin EC 81 MG tablet, Take by mouth. (Patient not taking: Reported on 08/08/2022), Disp: , Rfl:    buPROPion (WELLBUTRIN XL) 150 MG 24 hr tablet, TAKE 1 TABLET BY MOUTH EVERY DAY, Disp: 90 tablet, Rfl: 1   ELDERBERRY PO, Take 75 mg by mouth. 2 PO QD (Patient not taking: Reported on 08/08/2022), Disp: , Rfl:    gabapentin (NEURONTIN) 300 MG capsule, Take 1 capsule (300 mg total) by mouth at bedtime., Disp: 90 capsule, Rfl: 1   losartan (COZAAR) 100 MG tablet, Take 1 tablet (100 mg total) by mouth daily., Disp: 90 tablet, Rfl: 1   Multiple Vitamin (MULTI-VITAMINS) TABS, Take by mouth. (Patient not taking: Reported on 08/08/2022), Disp: , Rfl:    omeprazole (PRILOSEC) 20 MG capsule, TAKE 1 CAPSULE BY MOUTH EVERY DAY (Patient not taking: Reported on 08/08/2022), Disp: 30 capsule, Rfl: 0   Rimegepant Sulfate (NURTEC) 75 MG TBDP, Take 1 tablet by mouth x 1 dose for acute headache.  (Max 75 mg/day) (Patient not taking: Reported on 08/08/2022), Disp: 16 tablet, Rfl: 0   sertraline (ZOLOFT) 100 MG tablet, Take 1 tablet (100 mg total) by mouth daily., Disp: 90 tablet, Rfl: 1   tamsulosin (FLOMAX) 0.4 MG CAPS capsule, Take 1 capsule (0.4 mg total) by mouth daily., Disp: 90 capsule, Rfl: 1   Medications ordered in this encounter:  Meds ordered this encounter  Medications   doxycycline (VIBRA-TABS) 100 MG tablet    Sig: Take 1 tablet (100 mg total) by mouth 2 (two) times daily for 10 days.    Dispense:  20 tablet    Refill:  0    Order Specific Question:   Supervising Provider    Answer:   Chase Picket [id number]   fluticasone (FLONASE) 50 MCG/ACT nasal spray    Sig: Place 2 sprays into both nostrils daily.    Dispense:  16 g    Refill:  0    Order Specific Question:   Supervising Provider    Answer:   Chase Picket [id number]   loratadine (CLARITIN) 10 MG tablet    Sig: Take 1 tablet (10 mg total) by mouth daily.    Dispense:  30 tablet    Refill:  0    Order Specific Question:   Supervising Provider    Answer:   Chase Picket [id number]     *If you need refills on other medications prior  to your next appointment, please contact your pharmacy*  Follow-Up: Call back or seek an in-person evaluation if the symptoms worsen or if the condition fails to improve as anticipated.  [place] [phone]  Other Instructions  -Take meds as prescribed -Rest -Use a cool mist humidifier especially during the winter months when heat dries out the air. - Use saline nose sprays frequently to help soothe nasal passages and promote drainage. -Saline irrigations of the nose can be very helpful if done frequently.             * 4X daily for 1 week*             * Use of a nettie pot can be helpful with this.  *Follow directions with this* *Boiled or distilled water only -stay hydrated by drinking plenty of fluids - Keep thermostat turn down low to  prevent drying out sinuses - For any cough or congestion- robitussin DM or Delsym as needed - For fever or aches or pains- take tylenol or ibuprofen as directed on bottle             * for fevers greater than 101 orally you may alternate ibuprofen and tylenol every 3 hours.  If you do not improve you will need a follow up visit in person.                  If you have been instructed to have an in-person evaluation today at a local Urgent Care facility, please use the link below. It will take you to a list of all of our available [facility], including address, phone number and hours of operation. Please do not delay care.  [facility]  If you or a family member do not have a primary care provider, use the link below to schedule a visit and establish care. When you choose a [facility] primary care physician or advanced practice provider, you gain a long-term partner in health. Find a Primary Care Provider  Learn more about 's in-office and virtual care options: [facility]

## 2022-10-24 NOTE — Progress Notes (Signed)
Virtual Visit Consent   Bryan Ochoa, you are scheduled for a virtual visit with a [facility] provider today. Just as with appointments in the office, your consent must be obtained to participate. Your consent will be active for this visit and any virtual visit you may have with one of our providers in the next 365 days. If you have a MyChart account, a copy of this consent can be sent to you electronically.  As this is a virtual visit, video technology does not allow for your provider to perform a traditional examination. This may limit your provider's ability to fully assess your condition. If your provider identifies any concerns that need to be evaluated in person or the need to arrange testing (such as labs, EKG, etc.), we will make arrangements to do so. Although advances in technology are sophisticated, we cannot ensure that it will always work on either your end or our end. If the connection with a video visit is poor, the visit may have to be switched to a telephone visit. With either a video or telephone visit, we are not always able to ensure that we have a secure connection.  By engaging in this virtual visit, you consent to the provision of healthcare and authorize for your insurance to be billed (if applicable) for the services provided during this visit. Depending on your insurance coverage, you may receive a charge related to this service.  I need to obtain your verbal consent now. Are you willing to proceed with your visit today? Bryan Ochoa has provided verbal consent on 10/24/2022 for a virtual visit (video or telephone). Perlie Mayo, NP  Date: 10/24/2022 2:22 PM  Virtual Visit via Video Note   I, Perlie Mayo, connected with  Bryan Ochoa  ([id number], 1961/01/22) on 10/24/22 at  2:15 PM EDT by a video-enabled telemedicine application and verified that I am speaking with the correct person using two identifiers.  Location: Patient: Virtual Visit Location Patient:  Home Provider: Virtual Visit Location Provider: Home Office   I discussed the limitations of evaluation and management by telemedicine and the availability of in person appointments. The patient expressed understanding and agreed to proceed.    History of Present Illness: Bryan Ochoa is a 62 y.o. who identifies as a male who was assigned male at birth, and is being seen today for cold and cough  Onset was nine days ago- sinus congestion, and pressure Associated symptoms are heavy mucus production, heavy sinus symptoms in spring  Modifying factors are sinus rinses, sudafed Denies chest pain, shortness of breath, ear pain, fevers, chills   Problems:  Patient Active Problem List   Diagnosis Date Noted   Prediabetes 08/30/2022   Chronic hepatitis C ([place]) 12/01/2021   Serum calcium elevated 04/10/2019   Restless legs 02/28/2018   Chronic pansinusitis 06/11/2017   Deviated nasal septum 06/11/2017   Hypertrophy of inferior nasal turbinate 06/11/2017   Nasal obstruction 06/11/2017   Nocturia 04/24/2017   Recurrent sinusitis 09/05/2016   Depression, recurrent ([place]) 10/26/2015   GERD (gastroesophageal reflux disease) 10/26/2015   Mixed hyperlipidemia 10/26/2015   Obesity 09/29/2014   Primary snoring 09/29/2014   Cerebral infarction due to unspecified mechanism 09/29/2014   Chronic daily headache 06/19/2014   Analgesic rebound headache 06/19/2014   Sleep apnea 06/19/2014   Drug-induced headache, not elsewhere classified, not intractable 06/19/2014   Essential (primary) hypertension 05/12/2014   Low back pain 05/12/2014    Allergies:  Allergies  Allergen  Reactions   Penicillins Other (See Comments)    Knots all over body. Other reaction(s): Other (See Comments) Knots all over body. Knots all over body.   Medications:  Current Outpatient Medications:    aspirin EC 81 MG tablet, Take by mouth. (Patient not taking: Reported on 08/08/2022), Disp: , Rfl:    buPROPion (WELLBUTRIN  XL) 150 MG 24 hr tablet, TAKE 1 TABLET BY MOUTH EVERY DAY, Disp: 90 tablet, Rfl: 1   ELDERBERRY PO, Take 75 mg by mouth. 2 PO QD (Patient not taking: Reported on 08/08/2022), Disp: , Rfl:    gabapentin (NEURONTIN) 300 MG capsule, Take 1 capsule (300 mg total) by mouth at bedtime., Disp: 90 capsule, Rfl: 1   losartan (COZAAR) 100 MG tablet, Take 1 tablet (100 mg total) by mouth daily., Disp: 90 tablet, Rfl: 1   Multiple Vitamin (MULTI-VITAMINS) TABS, Take by mouth. (Patient not taking: Reported on 08/08/2022), Disp: , Rfl:    omeprazole (PRILOSEC) 20 MG capsule, TAKE 1 CAPSULE BY MOUTH EVERY DAY (Patient not taking: Reported on 08/08/2022), Disp: 30 capsule, Rfl: 0   Rimegepant Sulfate (NURTEC) 75 MG TBDP, Take 1 tablet by mouth x 1 dose for acute headache. (Max 75 mg/day) (Patient not taking: Reported on 08/08/2022), Disp: 16 tablet, Rfl: 0   sertraline (ZOLOFT) 100 MG tablet, Take 1 tablet (100 mg total) by mouth daily., Disp: 90 tablet, Rfl: 1   tamsulosin (FLOMAX) 0.4 MG CAPS capsule, Take 1 capsule (0.4 mg total) by mouth daily., Disp: 90 capsule, Rfl: 1  Observations/Objective: Patient is well-developed, well-nourished in no acute distress.  Resting comfortably  at home.  Head is normocephalic, atraumatic.  No labored breathing.  Speech is clear and coherent with logical content.  Patient is alert and oriented at baseline.  Congestion tone  Assessment and Plan:  1. Acute bacterial sinusitis  - doxycycline (VIBRA-TABS) 100 MG tablet; Take 1 tablet (100 mg total) by mouth 2 (two) times daily for 10 days.  Dispense: 20 tablet; Refill: 0 - fluticasone (FLONASE) 50 MCG/ACT nasal spray; Place 2 sprays into both nostrils daily.  Dispense: 16 g; Refill: 0 - loratadine (CLARITIN) 10 MG tablet; Take 1 tablet (10 mg total) by mouth daily.  Dispense: 30 tablet; Refill: 0  -Take meds as prescribed -Rest -Use a cool mist humidifier especially during the winter months when heat dries out the air. - Use  saline nose sprays frequently to help soothe nasal passages and promote drainage. -Saline irrigations of the nose can be very helpful if done frequently.             * 4X daily for 1 week*             * Use of a nettie pot can be helpful with this.  *Follow directions with this* *Boiled or distilled water only -stay hydrated by drinking plenty of fluids - Keep thermostat turn down low to prevent drying out sinuses - For any cough or congestion- robitussin DM or Delsym as needed - For fever or aches or pains- take tylenol or ibuprofen as directed on bottle             * for fevers greater than 101 orally you may alternate ibuprofen and tylenol every 3 hours.  If you do not improve you will need a follow up visit in person.                  Follow Up Instructions: I discussed the assessment and treatment  plan with the patient. The patient was provided an opportunity to ask questions and all were answered. The patient agreed with the plan and demonstrated an understanding of the instructions.  A copy of instructions were sent to the patient via MyChart unless otherwise noted below.    The patient was advised to call back or seek an in-person evaluation if the symptoms worsen or if the condition fails to improve as anticipated.  Time:  I spent 10 minutes with the patient via telehealth technology discussing the above problems/concerns.    Perlie Mayo, NP

## 2022-11-29 ENCOUNTER — Encounter: Payer: Self-pay | Admitting: Family Medicine

## 2022-11-29 ENCOUNTER — Ambulatory Visit (INDEPENDENT_AMBULATORY_CARE_PROVIDER_SITE_OTHER): Payer: Federal, State, Local not specified - PPO | Admitting: Family Medicine

## 2022-11-29 VITALS — BP 157/100 | HR 77 | Resp 18 | Ht 72.0 in | Wt 253.0 lb

## 2022-11-29 DIAGNOSIS — E782 Mixed hyperlipidemia: Secondary | ICD-10-CM | POA: Diagnosis not present

## 2022-11-29 DIAGNOSIS — R7303 Prediabetes: Secondary | ICD-10-CM

## 2022-11-29 DIAGNOSIS — F339 Major depressive disorder, recurrent, unspecified: Secondary | ICD-10-CM

## 2022-11-29 DIAGNOSIS — I10 Essential (primary) hypertension: Secondary | ICD-10-CM | POA: Diagnosis not present

## 2022-11-29 DIAGNOSIS — G2581 Restless legs syndrome: Secondary | ICD-10-CM

## 2022-11-29 DIAGNOSIS — B182 Chronic viral hepatitis C: Secondary | ICD-10-CM

## 2022-11-29 DIAGNOSIS — G43709 Chronic migraine without aura, not intractable, without status migrainosus: Secondary | ICD-10-CM

## 2022-11-29 DIAGNOSIS — Z0000 Encounter for general adult medical examination without abnormal findings: Secondary | ICD-10-CM | POA: Diagnosis not present

## 2022-11-29 DIAGNOSIS — F32A Depression, unspecified: Secondary | ICD-10-CM

## 2022-11-29 DIAGNOSIS — R351 Nocturia: Secondary | ICD-10-CM

## 2022-11-29 DIAGNOSIS — Z1159 Encounter for screening for other viral diseases: Secondary | ICD-10-CM

## 2022-11-29 MED ORDER — TAMSULOSIN HCL 0.4 MG PO CAPS
0.4000 mg | ORAL_CAPSULE | Freq: Every day | ORAL | 1 refills | Status: DC
Start: 2022-11-29 — End: 2023-08-02

## 2022-11-29 MED ORDER — BUPROPION HCL ER (XL) 150 MG PO TB24
150.0000 mg | ORAL_TABLET | Freq: Every day | ORAL | 1 refills | Status: DC
Start: 2022-11-29 — End: 2023-06-25

## 2022-11-29 MED ORDER — ATORVASTATIN CALCIUM 20 MG PO TABS
20.0000 mg | ORAL_TABLET | Freq: Every day | ORAL | 0 refills | Status: DC
Start: 2022-11-29 — End: 2023-02-26

## 2022-11-29 MED ORDER — GABAPENTIN 300 MG PO CAPS
300.0000 mg | ORAL_CAPSULE | Freq: Every day | ORAL | 1 refills | Status: DC
Start: 2022-11-29 — End: 2023-06-25

## 2022-11-29 MED ORDER — NURTEC 75 MG PO TBDP: ORAL_TABLET | ORAL | 0 refills | Status: DC

## 2022-11-29 MED ORDER — SERTRALINE HCL 100 MG PO TABS: 100.0000 mg | ORAL_TABLET | Freq: Every day | ORAL | 1 refills | Status: DC

## 2022-11-29 MED ORDER — LOSARTAN POTASSIUM 100 MG PO TABS: 100.0000 mg | ORAL_TABLET | Freq: Every day | ORAL | 1 refills | Status: DC

## 2022-11-29 NOTE — Assessment & Plan Note (Signed)
Per last note from gastroenterologist, safe to resume statin therapy.  Also recommended rechecking hepatitis C RNA July 2023 which was not done.  Will recheck HCV RNA with lab work.

## 2022-11-29 NOTE — Assessment & Plan Note (Addendum)
Last lipid panel: LDL 112, HDL 36, triglycerides 145.  Was previously taking atorvastatin which was discontinued for hep C treatment.  Per gastroenterologist's last note in care everywhere, it is safe to resume atorvastatin as of February 2023.  Restarting atorvastatin 20 mg daily, will repeat lipid panel and CMP for liver function at follow-up.

## 2022-11-29 NOTE — Assessment & Plan Note (Signed)
Last A1c 5.9, repeating A1c with labs ordered today.

## 2022-11-29 NOTE — Assessment & Plan Note (Signed)
Stable.  Continue gabapentin 300 mg daily at bedtime.  Will continue to monitor.

## 2022-11-29 NOTE — Assessment & Plan Note (Signed)
Continue Nurtec 75 mg as needed for migraine.

## 2022-11-29 NOTE — Progress Notes (Signed)
Complete physical exam  Patient: Bryan Ochoa   DOB: 1961/01/16   62 y.o. Male  MRN: [id number]  Subjective:    Chief Complaint  Patient presents with   Annual Exam    Bryan Ochoa is a 62 y.o. male who presents today for a complete physical exam. He reports consuming a general diet. The patient does not participate in regular exercise at present. He generally feels fairly well. He reports sleeping poorly.  He has a CPAP machine but has not used it in a long time.  Now, he has issues falling asleep and staying asleep and still gets up several times during the night to urinate.  He does not have additional problems to discuss today.    Most recent fall risk assessment:    11/29/2022    3:46 PM  Fall Risk   Falls in the past year? 0  Number falls in past yr: 0  Injury with Fall? 0  Risk for fall due to : No Fall Risks  Follow up Falls evaluation completed     Most recent depression and anxiety screenings:    11/29/2022    3:46 PM 08/08/2022    1:01 PM  PHQ 2/9 Scores  PHQ - 2 Score 2 3  PHQ- 9 Score 11 13      11/29/2022    3:46 PM 08/08/2022    1:02 PM 12/01/2021    4:16 PM 09/01/2021    4:34 PM  GAD 7 : Generalized Anxiety Score  Nervous, Anxious, on Edge 1 3 1 3   Control/stop worrying 1 3 1 1   Worry too much - different things 1 3 1 1   Trouble relaxing 1 1 1 1   Restless 1 3 1 1   Easily annoyed or irritable 2 3 0 3  Afraid - awful might happen 0 1 1 1   Total GAD 7 Score 7 17 6 11   Anxiety Difficulty Not difficult at all Extremely difficult Not difficult at all Very difficult    Patient Active Problem List   Diagnosis Date Noted   Prediabetes 08/30/2022   Chronic hepatitis C 12/01/2021   Serum calcium elevated 04/10/2019   Restless legs 02/28/2018   Chronic pansinusitis 06/11/2017   Deviated nasal septum 06/11/2017   Hypertrophy of inferior nasal turbinate 06/11/2017   Nasal obstruction 06/11/2017   Nocturia 04/24/2017   Recurrent sinusitis 09/05/2016    Depression 10/26/2015   GERD (gastroesophageal reflux disease) 10/26/2015   Mixed hyperlipidemia 10/26/2015   Obesity 09/29/2014   Snoring 09/29/2014   Cerebral infarction 09/29/2014   Sleep apnea 06/19/2014   Chronic migraine without aura without status migrainosus, not intractable 06/19/2014   Essential hypertension 05/12/2014   Midline low back pain without sciatica 05/12/2014    Past Surgical History:  Procedure Laterality Date   BACK SURGERY     CHOLECYSTECTOMY, LAPAROSCOPIC     NASAL SINUS SURGERY     SEPTOPLASTY     SPINE SURGERY N/A    [employer] 07/21/2020   WISDOM TOOTH EXTRACTION     Social History   Tobacco Use   Smoking status: Former    Types: Cigarettes    Quit date: 06/12/2014    Years since quitting: 8.4    Passive exposure: Never   Smokeless tobacco: Never  Vaping Use   Vaping Use: Never used  Substance Use Topics   Alcohol use: No    Alcohol/week: 0.0 standard drinks of alcohol   Drug use: No   Family History  Problem Relation Age of Onset   Diabetes Mother    Heart disease Mother    Hypertension Mother    Cancer Mother    Pancreatic cancer Father    Diabetes Father    Hypertension Father    Heart disease Father    Lung disease Father    Alcohol abuse Father    Crohn's disease Sister    COPD Other        NEPHEW   Prostate cancer Maternal Grandfather    Allergies  Allergen Reactions   Penicillins Other (See Comments)    Knots all over body. Other reaction(s): Other (See Comments) Knots all over body. Knots all over body.     Patient Care Team: Melida Quitter, PA as PCP - General (Family Medicine) Charna Elizabeth, MD as Consulting Physician (Gastroenterology) Gerrie Nordmann, [place] (Physician Assistant) Julio Sicks, MD as Consulting Physician (Neurosurgery) [facility] (Podiatry)   Outpatient Medications Prior to Visit  Medication Sig   [DISCONTINUED] buPROPion (WELLBUTRIN XL) 150 MG 24 hr tablet TAKE 1  TABLET BY MOUTH EVERY DAY   [DISCONTINUED] fluticasone (FLONASE) 50 MCG/ACT nasal spray Place 2 sprays into both nostrils daily.   [DISCONTINUED] gabapentin (NEURONTIN) 300 MG capsule Take 1 capsule (300 mg total) by mouth at bedtime.   [DISCONTINUED] loratadine (CLARITIN) 10 MG tablet Take 1 tablet (10 mg total) by mouth daily.   [DISCONTINUED] losartan (COZAAR) 100 MG tablet Take 1 tablet (100 mg total) by mouth daily.   [DISCONTINUED] Rimegepant Sulfate (NURTEC) 75 MG TBDP Take 1 tablet by mouth x 1 dose for acute headache. (Max 75 mg/day)   [DISCONTINUED] sertraline (ZOLOFT) 100 MG tablet Take 1 tablet (100 mg total) by mouth daily.   [DISCONTINUED] tamsulosin (FLOMAX) 0.4 MG CAPS capsule Take 1 capsule (0.4 mg total) by mouth daily.   [DISCONTINUED] aspirin EC 81 MG tablet Take by mouth. (Patient not taking: Reported on 08/08/2022)   [DISCONTINUED] ELDERBERRY PO Take 75 mg by mouth. 2 PO QD (Patient not taking: Reported on 08/08/2022)   [DISCONTINUED] Multiple Vitamin (MULTI-VITAMINS) TABS Take by mouth. (Patient not taking: Reported on 08/08/2022)   [DISCONTINUED] omeprazole (PRILOSEC) 20 MG capsule TAKE 1 CAPSULE BY MOUTH EVERY DAY (Patient not taking: Reported on 08/08/2022)   No facility-administered medications prior to visit.    Review of Systems  Constitutional:  Negative for chills, fever and malaise/fatigue.  HENT:  Negative for congestion and hearing loss.   Eyes:  Negative for blurred vision and double vision.  Respiratory:  Negative for cough and shortness of breath.   Cardiovascular:  Negative for chest pain, palpitations and leg swelling.  Gastrointestinal:  Negative for abdominal pain, constipation, diarrhea and heartburn.  Genitourinary:  Negative for frequency and urgency.  Musculoskeletal:  Negative for myalgias and neck pain.  Neurological:  Negative for headaches.  Endo/Heme/Allergies:  Negative for polydipsia.  Psychiatric/Behavioral:  Positive for depression (Baseline  for him since losing his son several years ago). The patient has insomnia. The patient is not nervous/anxious.      Objective:    BP (!) 157/100   Pulse 77   Resp 18   Ht 6' (1.829 m)   Wt 253 lb (114.8 kg)   SpO2 96%   BMI 34.31 kg/m    Physical Exam Constitutional:      General: He is not in acute distress.    Appearance: Normal appearance.  HENT:     Head: Normocephalic and atraumatic.  Right Ear: Tympanic membrane, ear canal and external ear normal.     Left Ear: Tympanic membrane, ear canal and external ear normal.     Nose: Nose normal.     Mouth/Throat:     Mouth: Mucous membranes are moist.     Pharynx: Oropharynx is clear.  Eyes:     Pupils: Pupils are equal, round, and reactive to light.  Neck:     Thyroid: No thyromegaly or thyroid tenderness.  Cardiovascular:     Rate and Rhythm: Normal rate and regular rhythm.     Pulses: Normal pulses.     Heart sounds: No murmur heard.    No friction rub. No gallop.  Pulmonary:     Effort: Pulmonary effort is normal.     Breath sounds: Normal breath sounds. No wheezing, rhonchi or rales.  Abdominal:     General: Bowel sounds are normal.     Palpations: Abdomen is soft. There is no mass.     Tenderness: There is no abdominal tenderness.  Musculoskeletal:        General: Normal range of motion.     Cervical back: Normal range of motion and neck supple.  Lymphadenopathy:     Cervical: No cervical adenopathy.  Skin:    General: Skin is warm and dry.  Neurological:     Mental Status: He is alert and oriented to person, place, and time.     Cranial Nerves: No cranial nerve deficit.     Deep Tendon Reflexes: Reflexes normal.  Psychiatric:        Mood and Affect: Mood normal.       Assessment & Plan:    Routine Health Maintenance and Physical Exam  Immunization History  Administered Date(s) Administered   Moderna Sars-Covid-2 Vaccination 04/15/2020, 09/10/2020   Tdap 03/03/2015    Health Maintenance  Topic  Date Due   HIV Screening  Never done   Zoster Vaccines- Shingrix (1 of 2) Never done   COVID-19 Vaccine (3 - 2023-24 season) 04/07/2022   INFLUENZA VACCINE  03/08/2023   Fecal DNA (Cologuard)  08/04/2023   DTaP/Tdap/Td (2 - Td or Tdap) 03/02/2025   Hepatitis C Screening  Completed   HPV VACCINES  Aged Out   COLONOSCOPY (Pts 45-49yrs Insurance coverage will need to be confirmed)  Discontinued   Due for shingles vaccines, provided education and answer questions.  Patient will think about it, included printed patient educational materials and after visit summary.  Discussed health benefits of physical activity, and encouraged him to engage in regular exercise appropriate for his age and condition.  Wellness examination -     CBC with Differential/Platelet; Future -     Comprehensive metabolic panel; Future -     Hemoglobin A1c; Future -     Lipid panel; Future -     Nurtec; Take 1 tablet by mouth x 1 dose for acute headache. (Max 75 mg/day)  Dispense: 16 tablet; Refill: 0  Depression, recurrent Assessment & Plan: PHQ-9 score of 11, GAD-7 score of 7.  PHQ-9 is near his baseline.  Patient lost his son several years ago and feels that he has never been the same since then.  The medication improves his mood, but does not know if he will ever be the same as before this tragedy.  He has not been taking bupropion, so adding that back into his routine.  Continue Zoloft 100 mg daily and bupropion 150 mg daily.  Follow-up in 4 to 6 weeks.  Will continue to monitor.  Orders: -     CBC with Differential/Platelet; Future -     Comprehensive metabolic panel; Future -     buPROPion HCl ER (XL); Take 1 tablet (150 mg total) by mouth daily.  Dispense: 90 tablet; Refill: 1  Mixed hyperlipidemia Assessment & Plan: Last lipid panel: LDL 112, HDL 36, triglycerides 145.  Was previously taking atorvastatin which was discontinued for hep C treatment.  Per gastroenterologist's last note in care everywhere, it  is safe to resume atorvastatin as of February 2023.  Restarting atorvastatin 20 mg daily, will repeat lipid panel and CMP for liver function at follow-up.  Orders: -     Lipid panel; Future -     Atorvastatin Calcium; Take 1 tablet (20 mg total) by mouth daily.  Dispense: 90 tablet; Refill: 0  Essential (primary) hypertension Assessment & Plan: Blood pressure elevated 153/94 on intake, on repeat remained elevated.  Continue losartan 100 mg daily, if still high at follow-up appointment then we will add hydrochlorothiazide 12.5 mg daily or amiodarone 2.5 to 5 mg daily.  Repeating CMP for electrolytes and renal function.  Will continue to monitor.  Orders: -     Losartan Potassium; Take 1 tablet (100 mg total) by mouth daily.  Dispense: 90 tablet; Refill: 1  Prediabetes Assessment & Plan: Last A1c 5.9, repeating A1c with labs ordered today.  Orders: -     Hemoglobin A1c; Future  Chronic migraine without aura without status migrainosus, not intractable Assessment & Plan: Continue Nurtec 75 mg as needed for migraine.  Orders: -     Nurtec; Take 1 tablet by mouth x 1 dose for acute headache. (Max 75 mg/day)  Dispense: 16 tablet; Refill: 0  Depression, unspecified depression type Assessment & Plan: PHQ-9 score of 11, GAD-7 score of 7.  PHQ-9 is near his baseline.  Patient lost his son several years ago and feels that he has never been the same since then.  The medication improves his mood, but does not know if he will ever be the same as before this tragedy.  He has not been taking bupropion, so adding that back into his routine.  Continue Zoloft 100 mg daily and bupropion 150 mg daily.  Follow-up in 4 to 6 weeks.  Will continue to monitor.  Orders: -     Sertraline HCl; Take 1 tablet (100 mg total) by mouth daily.  Dispense: 90 tablet; Refill: 1  Nocturia Assessment & Plan: Screening PSA.  Continue tamsulosin 0.4 mg daily.  Orders: -     Tamsulosin HCl; Take 1 capsule (0.4 mg total)  by mouth daily.  Dispense: 90 capsule; Refill: 1 -     PSA; Future  Restless legs Assessment & Plan: Stable.  Continue gabapentin 300 mg daily at bedtime.  Will continue to monitor.  Orders: -     Gabapentin; Take 1 capsule (300 mg total) by mouth at bedtime.  Dispense: 90 capsule; Refill: 1  Screening for viral disease -     HIV Antibody (routine testing w rflx); Future  Chronic hepatitis C without hepatic coma Assessment & Plan: Per last note from gastroenterologist, safe to resume statin therapy.  Also recommended rechecking hepatitis C RNA July 2023 which was not done.  Will recheck HCV RNA with lab work.  Orders: -     HCV RNA quant   Return in about 6 weeks (around 01/10/2023) for follow-up for mood, added Wellbutrin.    Melida Quitter, PA

## 2022-11-29 NOTE — Assessment & Plan Note (Addendum)
PHQ-9 score of 11, GAD-7 score of 7.  PHQ-9 is near his baseline.  Patient lost his son several years ago and feels that he has never been the same since then.  The medication improves his mood, but does not know if he will ever be the same as before this tragedy.  He has not been taking bupropion, so adding that back into his routine.  Continue Zoloft 100 mg daily and bupropion 150 mg daily.  Follow-up in 4 to 6 weeks.  Will continue to monitor.

## 2022-11-29 NOTE — Assessment & Plan Note (Addendum)
Blood pressure elevated 153/94 on intake, on repeat remained elevated.  Continue losartan 100 mg daily, if still high at follow-up appointment then we will add hydrochlorothiazide 12.5 mg daily or amiodarone 2.5 to 5 mg daily.  Repeating CMP for electrolytes and renal function.  Will continue to monitor.

## 2022-11-29 NOTE — Assessment & Plan Note (Signed)
Screening PSA.  Continue tamsulosin 0.4 mg daily.

## 2022-11-29 NOTE — Patient Instructions (Signed)
I am checking to see if we can restart the atorvastatin for you.  I will send you a message and send into the pharmacy if you are able to.  I am also going to check on what the next steps to get the CPAP refitted for you are.

## 2022-11-30 ENCOUNTER — Other Ambulatory Visit: Payer: Federal, State, Local not specified - PPO

## 2022-11-30 DIAGNOSIS — R7303 Prediabetes: Secondary | ICD-10-CM | POA: Diagnosis not present

## 2022-11-30 DIAGNOSIS — F339 Major depressive disorder, recurrent, unspecified: Secondary | ICD-10-CM

## 2022-11-30 DIAGNOSIS — E782 Mixed hyperlipidemia: Secondary | ICD-10-CM | POA: Diagnosis not present

## 2022-11-30 DIAGNOSIS — Z1159 Encounter for screening for other viral diseases: Secondary | ICD-10-CM | POA: Diagnosis not present

## 2022-11-30 DIAGNOSIS — R351 Nocturia: Secondary | ICD-10-CM | POA: Diagnosis not present

## 2022-11-30 DIAGNOSIS — Z0000 Encounter for general adult medical examination without abnormal findings: Secondary | ICD-10-CM

## 2022-12-01 LAB — CBC WITH DIFFERENTIAL/PLATELET
Basophils Absolute: 0.1 10*3/uL (ref 0.0–0.2)
Basos: 1 %
EOS (ABSOLUTE): 0.3 10*3/uL (ref 0.0–0.4)
Eos: 4 %
Hematocrit: 49.9 % (ref 37.5–51.0)
Hemoglobin: 16.9 g/dL (ref 13.0–17.7)
Immature Grans (Abs): 0 10*3/uL (ref 0.0–0.1)
Immature Granulocytes: 0 %
Lymphocytes Absolute: 2.8 10*3/uL (ref 0.7–3.1)
Lymphs: 35 %
MCH: 29.3 pg (ref 26.6–33.0)
MCHC: 33.9 g/dL (ref 31.5–35.7)
MCV: 87 fL (ref 79–97)
Monocytes Absolute: 0.6 10*3/uL (ref 0.1–0.9)
Monocytes: 7 %
Neutrophils Absolute: 4.1 10*3/uL (ref 1.4–7.0)
Neutrophils: 53 %
Platelets: 267 10*3/uL (ref 150–450)
RBC: 5.76 x10E6/uL (ref 4.14–5.80)
RDW: 14.4 % (ref 11.6–15.4)
WBC: 7.9 10*3/uL (ref 3.4–10.8)

## 2022-12-01 LAB — COMPREHENSIVE METABOLIC PANEL
ALT: 18 IU/L (ref 0–44)
AST: 21 IU/L (ref 0–40)
Albumin/Globulin Ratio: 1.6 (ref 1.2–2.2)
Albumin: 4.4 g/dL (ref 3.8–4.9)
Alkaline Phosphatase: 92 IU/L (ref 44–121)
BUN/Creatinine Ratio: 19 (ref 10–24)
BUN: 18 mg/dL (ref 8–27)
Bilirubin Total: 0.4 mg/dL (ref 0.0–1.2)
CO2: 19 mmol/L — ABNORMAL LOW (ref 20–29)
Calcium: 9.7 mg/dL (ref 8.6–10.2)
Chloride: 103 mmol/L (ref 96–106)
Creatinine, Ser: 0.94 mg/dL (ref 0.76–1.27)
Globulin, Total: 2.7 g/dL (ref 1.5–4.5)
Glucose: 109 mg/dL — ABNORMAL HIGH (ref 70–99)
Potassium: 4.4 mmol/L (ref 3.5–5.2)
Sodium: 138 mmol/L (ref 134–144)
Total Protein: 7.1 g/dL (ref 6.0–8.5)
eGFR: 93 mL/min/{1.73_m2} (ref >59)

## 2022-12-01 LAB — LIPID PANEL
Chol/HDL Ratio: 5.8 ratio — ABNORMAL HIGH (ref 0.0–5.0)
Cholesterol, Total: 215 mg/dL — ABNORMAL HIGH (ref 100–199)
HDL: 37 mg/dL — ABNORMAL LOW (ref >39)
LDL Chol Calc (NIH): 156 mg/dL — ABNORMAL HIGH (ref 0–99)
Triglycerides: 120 mg/dL (ref 0–149)
VLDL Cholesterol Cal: 22 mg/dL (ref 5–40)

## 2022-12-01 LAB — HEMOGLOBIN A1C
Est. average glucose Bld gHb Est-mCnc: 126 mg/dL
Hgb A1c MFr Bld: 6 % — ABNORMAL HIGH (ref 4.8–5.6)

## 2022-12-01 LAB — HIV ANTIBODY (ROUTINE TESTING W REFLEX): HIV Screen 4th Generation wRfx: NONREACTIVE

## 2022-12-01 LAB — PSA: Prostate Specific Ag, Serum: 0.8 ng/mL (ref 0.0–4.0)

## 2022-12-08 ENCOUNTER — Encounter: Payer: Self-pay | Admitting: Family Medicine

## 2022-12-08 DIAGNOSIS — K219 Gastro-esophageal reflux disease without esophagitis: Secondary | ICD-10-CM

## 2022-12-11 MED ORDER — OMEPRAZOLE 20 MG PO CPDR
20.0000 mg | DELAYED_RELEASE_CAPSULE | Freq: Every day | ORAL | 0 refills | Status: DC
Start: 2022-12-11 — End: 2023-06-04

## 2023-01-11 ENCOUNTER — Ambulatory Visit: Payer: Federal, State, Local not specified - PPO | Admitting: Family Medicine

## 2023-01-11 NOTE — Progress Notes (Deleted)
   Established Patient Office Visit  Subjective   Patient ID: Bryan Ochoa, male    DOB: 1961/03/02  Age: 62 y.o. MRN: [id number]  No chief complaint on file.   HPI Bryan Ochoa is a 62 y.o. male presenting today for follow up of mood. Mood: Patient is here to follow up for depression, currently managing with Zoloft and bupropion. At last appointment PHQ-9 score 11, GAD-7 score 7. Bupropion was added back to medication regimen. Taking medication without side effects, reports {excellent/good/fair/poor:19665} compliance with treatment. Denies mood changes or SI/HI. He feels mood is {improved/worse/stable:29130} since last visit. Denies chest pain, difficulty concentrating, dizziness, fatigue, insomnia, irritability, palpitations, panic attacks, racing thoughts, SOB, sweating. Denies anhedonia, depressed mood, difficulty concentrating, fatigue, feelings of worthlessness/guilt, hopelessness, hypersomnia, impaired memory, insomnia, psychomotor agitation, psychomotor retardation, recurrent thoughts of death, weight changes.     12-15-22    3:46 PM 08/08/2022    1:01 PM 12/01/2021    4:16 PM  Depression screen PHQ 2/9  Decreased Interest 2 3 3   Down, Depressed, Hopeless 0 0 0  PHQ - 2 Score 2 3 3   Altered sleeping 3 1 0  Tired, decreased energy 3 3 3   Change in appetite 2 3 3   Feeling bad or failure about yourself  0 1 1  Trouble concentrating 0 1 1  Moving slowly or fidgety/restless 1 1 0  Suicidal thoughts 0 0 0  PHQ-9 Score 11 13 11   Difficult doing work/chores Somewhat difficult Not difficult at all Not difficult at all       12/15/2022    3:46 PM 08/08/2022    1:02 PM 12/01/2021    4:16 PM 09/01/2021    4:34 PM  GAD 7 : Generalized Anxiety Score  Nervous, Anxious, on Edge 1 3 1 3   Control/stop worrying 1 3 1 1   Worry too much - different things 1 3 1 1   Trouble relaxing 1 1 1 1   Restless 1 3 1 1   Easily annoyed or irritable 2 3 0 3  Afraid - awful might happen 0 1 1 1   Total  GAD 7 Score 7 17 6 11   Anxiety Difficulty Not difficult at all Extremely difficult Not difficult at all Very difficult   ROS Negative unless otherwise noted in HPI   Objective:     There were no vitals taken for this visit.  Physical Exam   Assessment & Plan:  There are no diagnoses linked to this encounter.  No follow-ups on file.  If BP elevated add hctz 12.5 mg daily  Bryan Ochoa, [place]

## 2023-01-31 IMAGING — US US LIVER ELASTOGRAPHY
1 series · 12 of 25 positions shown · non-contrast
Comparison: RUQ ultrasound 01/29/2020.

CLINICAL DATA: Hepatic steatosis

EXAM:
US LIVER ELASTOGRAPHY
TECHNIQUE: Sonography of the liver was performed. In addition, ultrasound
elastography evaluation of the liver was performed. A region of
interest was placed within the right lobe of the liver. Following
application of a compressive sonographic pulse, tissue
compressibility was assessed. Multiple assessments were performed at
the selected site. Median tissue compressibility was determined.
Previously, hepatic stiffness was assessed by shear wave velocity.
Based on recently published Society of Radiologists in Ultrasound
consensus article, reporting is now recommended to be performed in
the SI units of pressure (kiloPascals) representing hepatic
stiffness/elasticity. The obtained result is compared to the
published reference standards. (cACLD = compensated Advanced Chronic
Liver Disease)

[Series 1: us liver elastography · 12 of 39 slices shown]
[im 2/39]
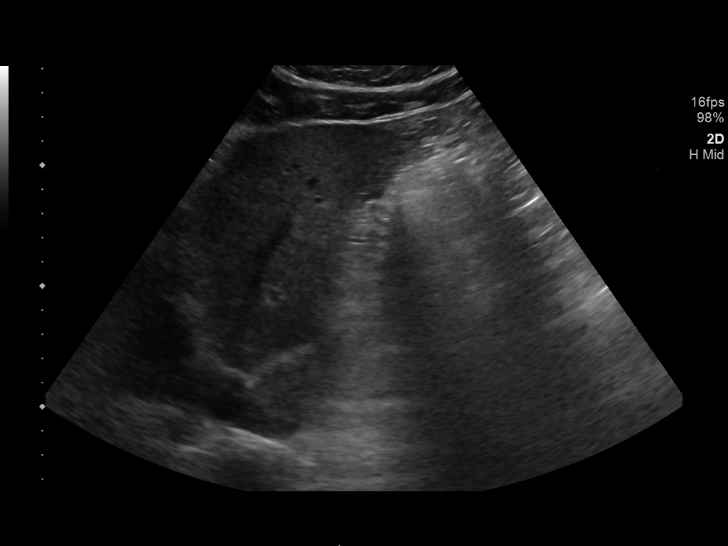
[im 5/39]
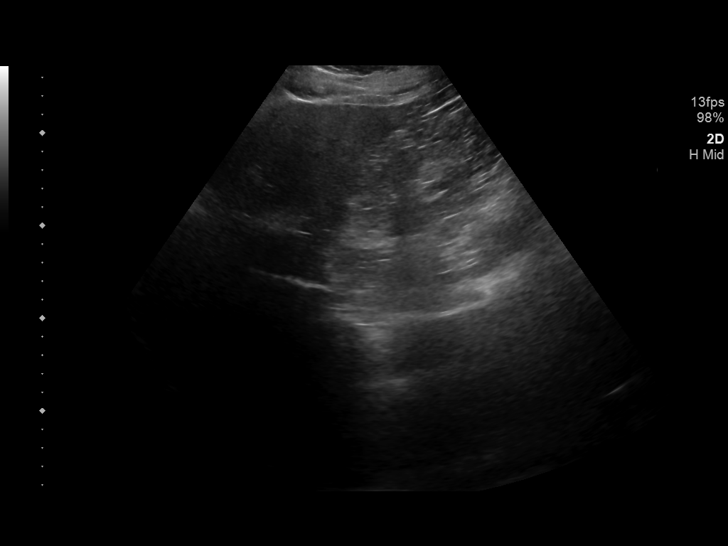
[im 8/39]
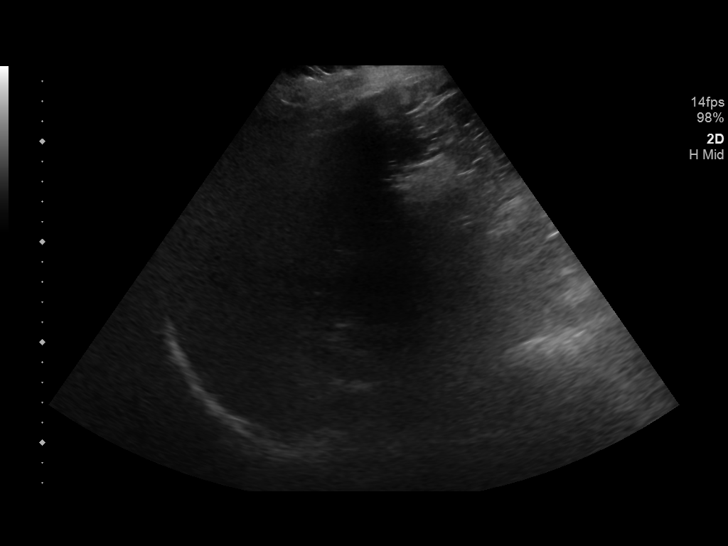
[im 12/39]
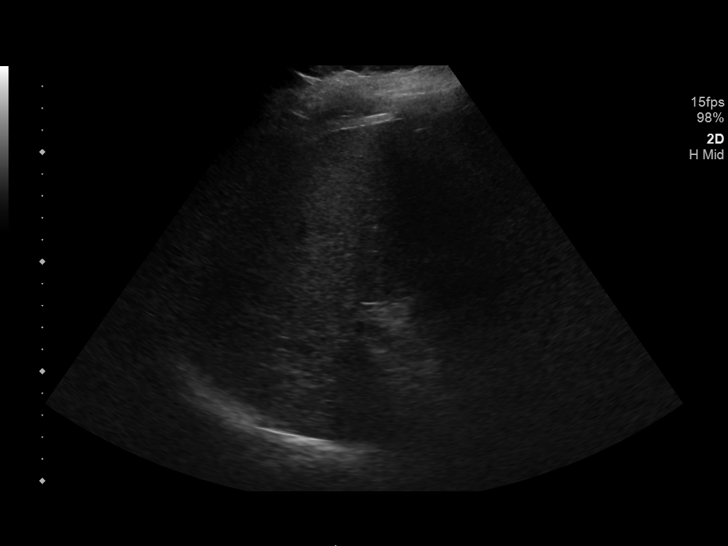
[im 15/39]
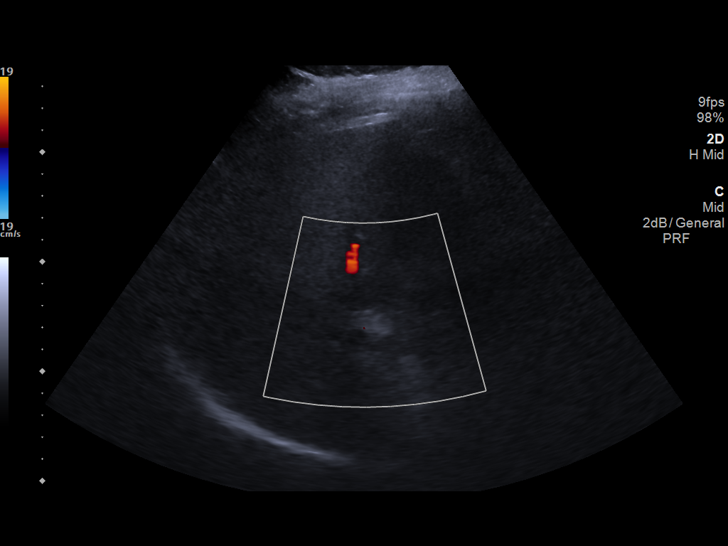
[im 18/39]
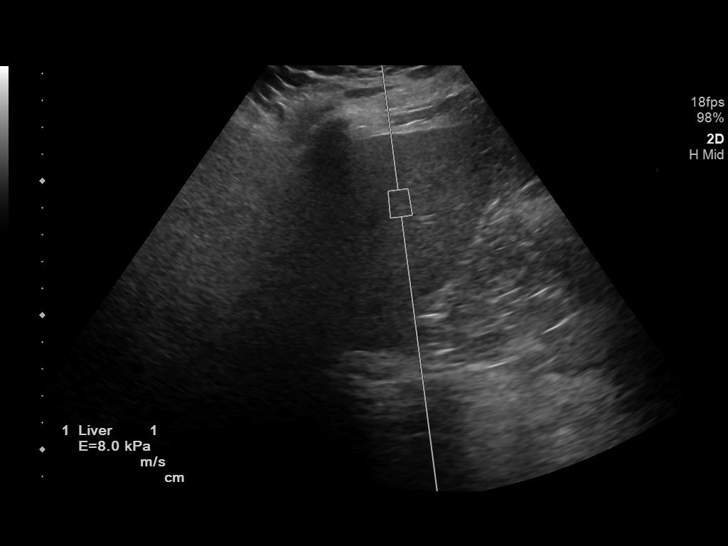
[im 21/39]
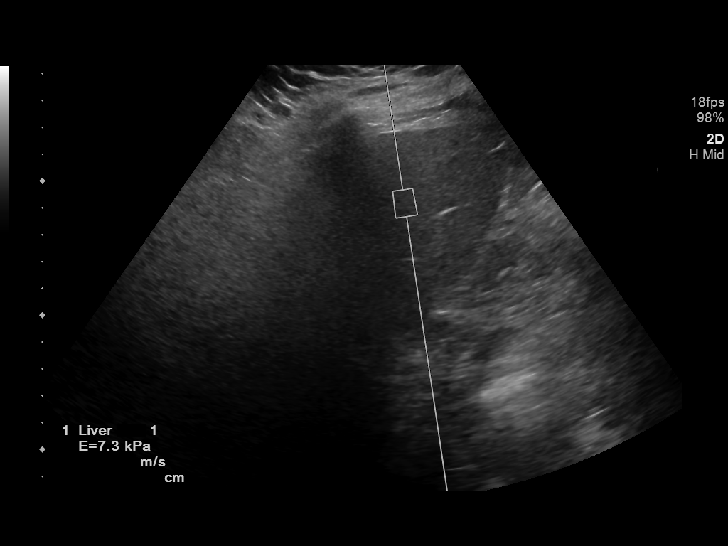
[im 24/39]
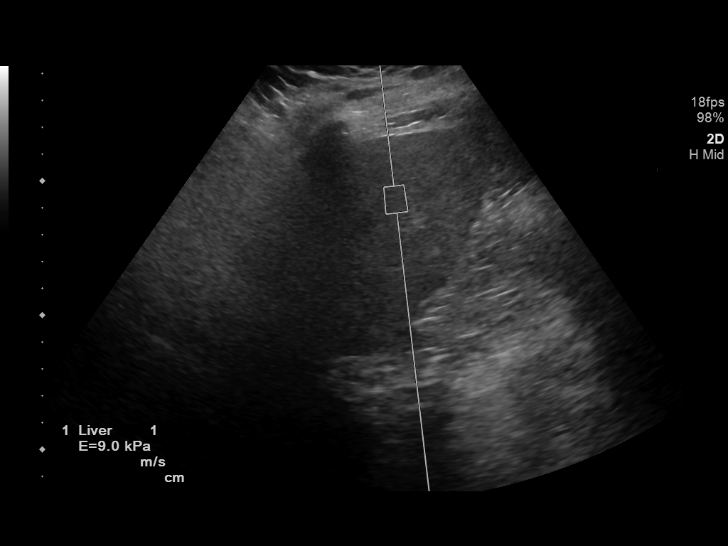
[im 27/39]
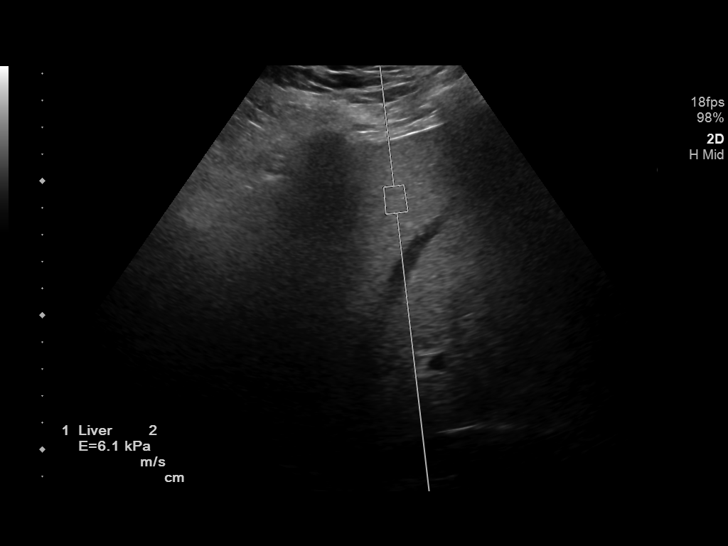
[im 31/39]
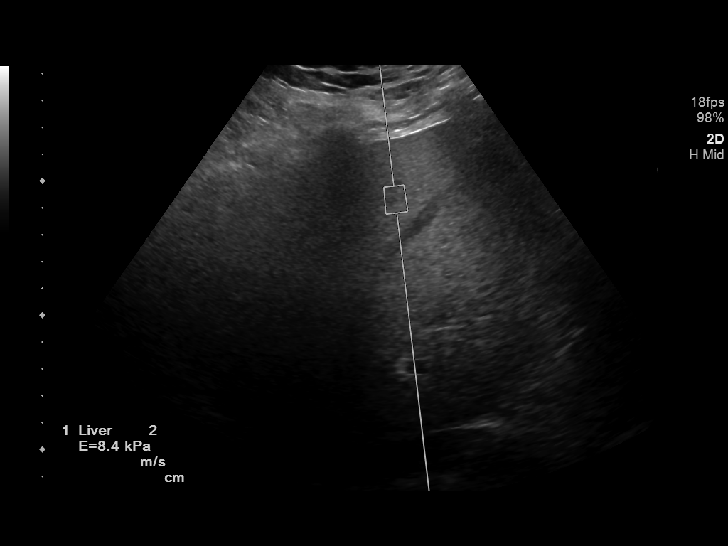
[im 34/39]
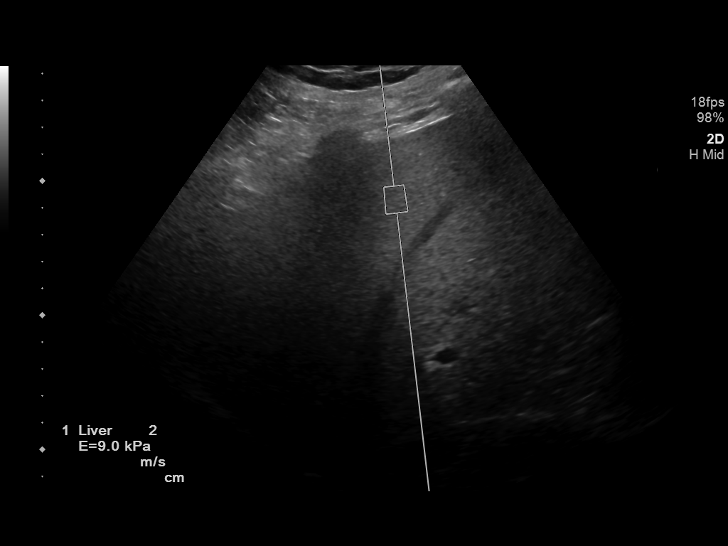
[im 37/39]
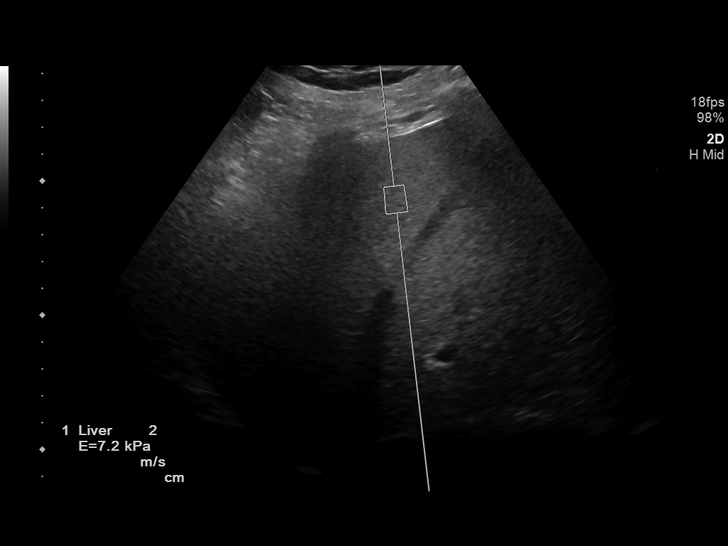

[12 of 25 positions shown; findings below may reference images not displayed]

FINDINGS: Liver: No focal lesion identified. Diffusely increased parenchymal
echogenicity. Portal vein is patent on color Doppler imaging with
normal direction of blood flow towards the liver.

ULTRASOUND HEPATIC ELASTOGRAPHY

Device: Siemens Helix VTQ

Patient position: Supine

Transducer: [id number]

Number of measurements: 10

Hepatic segment:  8

Median kPa:

IQR:

IQR/Median kPa ratio:

Data quality:  Good

Diagnostic category: < or = 9 kPa: in the absence of other known
clinical signs, rules out cACLD

The use of hepatic elastography is applicable to patients with viral
hepatitis and non-alcoholic fatty liver disease. At this time, there
is insufficient data for the referenced cut-off values and use in
other causes of liver disease, including alcoholic liver disease.
Patients, however, may be assessed by elastography and serve as
their own reference standard/baseline.

In patients with non-alcoholic liver disease, the values suggesting
compensated advanced chronic liver disease (cACLD) may be lower, and
patients may need additional testing with elasticity results of [DATE]
kPa.

Please note that abnormal hepatic elasticity and shear wave
velocities may also be identified in clinical settings other than
with hepatic fibrosis, such as: acute hepatitis, elevated right
heart and central venous pressures including use of beta blockers,
Gillermo disease ([place]), infiltrative processes such as
mastocytosis/amyloidosis/infiltrative tumor/lymphoma, extrahepatic
cholestasis, with hyperemia in the post-prandial state, and with
liver transplantation. Correlation with patient history, laboratory
data, and clinical condition recommended.

Diagnostic Categories:

< or =5 kPa: high probability of being normal

< or =9 kPa: in the absence of other known clinical signs, rules [DATE] kPa and ?13 kPa: suggestive of cACLD, but needs further testing

>13 kPa: highly suggestive of cACLD

> or =17 kPa: highly suggestive of cACLD with an increased
probability of clinically significant portal hypertension
IMPRESSION: ULTRASOUND LIVER:

Hepatic steatosis without suspicious hepatic lesion.

ULTRASOUND HEPATIC ELASTOGRAPHY:

Median kPa:

Diagnostic category: < or = 9 kPa: in the absence of other known
clinical signs, rules out cACLD

## 2023-02-26 ENCOUNTER — Other Ambulatory Visit: Payer: Self-pay | Admitting: Family Medicine

## 2023-02-26 DIAGNOSIS — E782 Mixed hyperlipidemia: Secondary | ICD-10-CM

## 2023-05-07 ENCOUNTER — Other Ambulatory Visit: Payer: Self-pay | Admitting: Family Medicine

## 2023-05-07 DIAGNOSIS — K219 Gastro-esophageal reflux disease without esophagitis: Secondary | ICD-10-CM

## 2023-05-07 DIAGNOSIS — G43709 Chronic migraine without aura, not intractable, without status migrainosus: Secondary | ICD-10-CM

## 2023-05-07 DIAGNOSIS — I10 Essential (primary) hypertension: Secondary | ICD-10-CM

## 2023-05-07 DIAGNOSIS — E782 Mixed hyperlipidemia: Secondary | ICD-10-CM

## 2023-05-07 DIAGNOSIS — Z0000 Encounter for general adult medical examination without abnormal findings: Secondary | ICD-10-CM

## 2023-06-04 ENCOUNTER — Other Ambulatory Visit: Payer: Self-pay | Admitting: Family Medicine

## 2023-06-04 DIAGNOSIS — K219 Gastro-esophageal reflux disease without esophagitis: Secondary | ICD-10-CM

## 2023-06-05 MED ORDER — OMEPRAZOLE 20 MG PO CPDR
20.0000 mg | DELAYED_RELEASE_CAPSULE | Freq: Every day | ORAL | 0 refills | Status: DC
Start: 2023-06-05 — End: 2023-09-03

## 2023-06-07 ENCOUNTER — Ambulatory Visit: Payer: Federal, State, Local not specified - PPO | Admitting: Family Medicine

## 2023-06-25 ENCOUNTER — Ambulatory Visit: Payer: Federal, State, Local not specified - PPO | Admitting: Family Medicine

## 2023-06-25 ENCOUNTER — Encounter: Payer: Self-pay | Admitting: Family Medicine

## 2023-06-25 ENCOUNTER — Other Ambulatory Visit: Payer: Self-pay | Admitting: Family Medicine

## 2023-06-25 VITALS — BP 123/82 | HR 95 | Resp 18 | Ht 72.0 in | Wt 258.0 lb

## 2023-06-25 DIAGNOSIS — E782 Mixed hyperlipidemia: Secondary | ICD-10-CM

## 2023-06-25 DIAGNOSIS — G2581 Restless legs syndrome: Secondary | ICD-10-CM

## 2023-06-25 DIAGNOSIS — Z0000 Encounter for general adult medical examination without abnormal findings: Secondary | ICD-10-CM

## 2023-06-25 DIAGNOSIS — G43709 Chronic migraine without aura, not intractable, without status migrainosus: Secondary | ICD-10-CM

## 2023-06-25 DIAGNOSIS — F339 Major depressive disorder, recurrent, unspecified: Secondary | ICD-10-CM

## 2023-06-25 DIAGNOSIS — I10 Essential (primary) hypertension: Secondary | ICD-10-CM

## 2023-06-25 MED ORDER — NURTEC 75 MG PO TBDP: ORAL_TABLET | ORAL | 0 refills | Status: DC

## 2023-06-25 MED ORDER — LOSARTAN POTASSIUM 100 MG PO TABS: 100.0000 mg | ORAL_TABLET | Freq: Every day | ORAL | 1 refills | Status: DC

## 2023-06-25 MED ORDER — BUPROPION HCL ER (XL) 150 MG PO TB24: 150.0000 mg | ORAL_TABLET | Freq: Every day | ORAL | 1 refills | Status: DC

## 2023-06-25 MED ORDER — GABAPENTIN 600 MG PO TABS: ORAL_TABLET | ORAL | 1 refills | Status: DC

## 2023-06-25 MED ORDER — ATORVASTATIN CALCIUM 20 MG PO TABS: 20.0000 mg | ORAL_TABLET | Freq: Every day | ORAL | 1 refills | Status: DC

## 2023-06-25 MED ORDER — SERTRALINE HCL 100 MG PO TABS: 150.0000 mg | ORAL_TABLET | Freq: Every day | ORAL | 1 refills | Status: DC

## 2023-06-25 NOTE — Assessment & Plan Note (Addendum)
PHQ-9 score with slight improvement to 9.  Continue bupropion 150 mg daily consistently, increase Zoloft to 150 mg daily.  Follow-up in about 2 months and adjust regimen as indicated.  Also discussed the option of referral to psychiatry for GeneSight testing and more focused treatment, patient would like to stay with primary care for now but may consider GeneSight testing based on trials with PCP.

## 2023-06-25 NOTE — Progress Notes (Signed)
Established Patient Office Visit  Subjective   Patient ID: Bryan Ochoa, male    DOB: 05-20-1961  Age: 62 y.o. MRN: [id number]  Chief Complaint  Patient presents with   Anxiety    HPI Bryan Ochoa is a 62 y.o. male presenting today for follow up of mood.  At last appointment, recommended to continue Zoloft 100 mg daily and restart bupropion 150 mg daily consistently.  Also states that gabapentin is not at helpful for restless legs, currently taking gabapentin 300 mg daily at bedtime.  He still endorses irritability, being easily aggravated, and having a temper that is ready to boil over at any given moment.  Zoloft was previously fantastic for mood but after stopping his medication entirely, it has been difficult to find the perfect balance of medication again.     11/29/2022    3:46 PM 08/08/2022    1:01 PM 12/01/2021    4:16 PM  Depression screen PHQ 2/9  Decreased Interest 2 3 3   Down, Depressed, Hopeless 0 0 0  PHQ - 2 Score 2 3 3   Altered sleeping 3 1 0  Tired, decreased energy 3 3 3   Change in appetite 2 3 3   Feeling bad or failure about yourself  0 1 1  Trouble concentrating 0 1 1  Moving slowly or fidgety/restless 1 1 0  Suicidal thoughts 0 0 0  PHQ-9 Score 11 13 11   Difficult doing work/chores Somewhat difficult Not difficult at all Not difficult at all      11/29/2022    3:46 PM 08/08/2022    1:02 PM 12/01/2021    4:16 PM 09/01/2021    4:34 PM  GAD 7 : Generalized Anxiety Score  Nervous, Anxious, on Edge 1 3 1 3   Control/stop worrying 1 3 1 1   Worry too much - different things 1 3 1 1   Trouble relaxing 1 1 1 1   Restless 1 3 1 1   Easily annoyed or irritable 2 3 0 3  Afraid - awful might happen 0 1 1 1   Total GAD 7 Score 7 17 6 11   Anxiety Difficulty Not difficult at all Extremely difficult Not difficult at all Very difficult    Outpatient Medications Prior to Visit  Medication Sig   omeprazole (PRILOSEC) 20 MG capsule Take 1 capsule (20 mg total) by mouth  daily.   tamsulosin (FLOMAX) 0.4 MG CAPS capsule Take 1 capsule (0.4 mg total) by mouth daily.   [DISCONTINUED] atorvastatin (LIPITOR) 20 MG tablet TAKE 1 TABLET BY MOUTH EVERY DAY   [DISCONTINUED] buPROPion (WELLBUTRIN XL) 150 MG 24 hr tablet Take 1 tablet (150 mg total) by mouth daily.   [DISCONTINUED] gabapentin (NEURONTIN) 300 MG capsule Take 1 capsule (300 mg total) by mouth at bedtime.   [DISCONTINUED] losartan (COZAAR) 100 MG tablet Take 1 tablet (100 mg total) by mouth daily.   [DISCONTINUED] Rimegepant Sulfate (NURTEC) 75 MG TBDP Take 1 tablet by mouth x 1 dose for acute headache. (Max 75 mg/day)   [DISCONTINUED] sertraline (ZOLOFT) 100 MG tablet Take 1 tablet (100 mg total) by mouth daily.   No facility-administered medications prior to visit.    ROS Negative unless otherwise noted in HPI   Objective:     BP 123/82 (BP Location: Left Arm, Patient Position: Sitting, Cuff Size: Large)   Pulse 95   Resp 18   Ht 6' (1.829 m)   Wt 258 lb (117 kg)   SpO2 95%   BMI 34.99 kg/m  Physical Exam Vitals reviewed.  Constitutional:      General: He is not in acute distress.    Appearance: Normal appearance. He is not ill-appearing.  HENT:     Head: Normocephalic and atraumatic.     Nose: Nose normal.  Eyes:     Conjunctiva/sclera: Conjunctivae normal.  Pulmonary:     Effort: Pulmonary effort is normal.  Musculoskeletal:     Cervical back: Normal range of motion.  Skin:    Coloration: Skin is not jaundiced or pale.     Findings: No bruising.  Neurological:     Mental Status: He is alert and oriented to person, place, and time.  Psychiatric:        Mood and Affect: Mood normal.     Assessment & Plan:  Depression, recurrent (HCC) Assessment & Plan: PHQ-9 score with slight improvement to 9.  Continue bupropion 150 mg daily consistently, increase Zoloft to 150 mg daily.  Follow-up in about 2 months and adjust regimen as indicated.  Also discussed the option of referral to  psychiatry for GeneSight testing and more focused treatment, patient would like to stay with primary care for now but may consider GeneSight testing based on trials with PCP.  Orders: -     buPROPion HCl ER (XL); Take 1 tablet (150 mg total) by mouth daily.  Dispense: 90 tablet; Refill: 1 -     Sertraline HCl; Take 1.5 tablets (150 mg total) by mouth daily.  Dispense: 135 tablet; Refill: 1  Restless legs Assessment & Plan: Optimize gabapentin therapy.  Start with gabapentin 600 mg 3 times daily OR gabapentin 600 mg every morning and 1200 mg at bedtime.  If this does not control RLS, will taper off over about 1 week.  Orders: -     Gabapentin; Take 1 tablet (600 mg total) by mouth 3 (three) times daily. OR Take 1 tablet each morning and 2 tablets at bedtime.  Dispense: 270 tablet; Refill: 1  Chronic migraine without aura without status migrainosus, not intractable -     Nurtec; Take 1 tablet by mouth x 1 dose for acute headache. (Max 75 mg/day)  Dispense: 16 tablet; Refill: 0  Mixed hyperlipidemia -     Atorvastatin Calcium; Take 1 tablet (20 mg total) by mouth daily.  Dispense: 90 tablet; Refill: 1  Essential (primary) hypertension -     Losartan Potassium; Take 1 tablet (100 mg total) by mouth daily.  Dispense: 90 tablet; Refill: 1  Provided refills for other chronic medications.  Return in about 2 months (around 08/25/2023) for follow-up for restless legs and mood.    Melida Quitter, PA

## 2023-06-25 NOTE — Assessment & Plan Note (Signed)
Optimize gabapentin therapy.  Start with gabapentin 600 mg 3 times daily OR gabapentin 600 mg every morning and 1200 mg at bedtime.  If this does not control RLS, will taper off over about 1 week.

## 2023-06-25 NOTE — Patient Instructions (Signed)
GABAPENTIN: We will increase your dose of gabapentin and optimize the medication before switching to something entirely different. Take 1 tablet (600 mg total) by mouth 3 (three) times daily.  OR  Take 1 tablet each morning and 2 tablets at bedtime.  ZOLOFT: Start taking 1.5 tablets each day (150 mg total).

## 2023-07-21 ENCOUNTER — Telehealth: Payer: Federal, State, Local not specified - PPO | Admitting: Physician Assistant

## 2023-07-21 DIAGNOSIS — J0191 Acute recurrent sinusitis, unspecified: Secondary | ICD-10-CM

## 2023-07-21 MED ORDER — AZITHROMYCIN 250 MG PO TABS: ORAL_TABLET | ORAL | 0 refills | Status: AC

## 2023-07-21 MED ORDER — FLUTICASONE PROPIONATE 50 MCG/ACT NA SUSP: 2.0000 | Freq: Every day | NASAL | 6 refills | Status: AC

## 2023-07-21 NOTE — Progress Notes (Signed)
Virtual Visit Consent   CREEDE CONIGLIO, you are scheduled for a virtual visit with a [facility] provider today. Just as with appointments in the office, your consent must be obtained to participate. Your consent will be active for this visit and any virtual visit you may have with one of our providers in the next 365 days. If you have a MyChart account, a copy of this consent can be sent to you electronically.  As this is a virtual visit, video technology does not allow for your provider to perform a traditional examination. This may limit your provider's ability to fully assess your condition. If your provider identifies any concerns that need to be evaluated in person or the need to arrange testing (such as labs, EKG, etc.), we will make arrangements to do so. Although advances in technology are sophisticated, we cannot ensure that it will always work on either your end or our end. If the connection with a video visit is poor, the visit may have to be switched to a telephone visit. With either a video or telephone visit, we are not always able to ensure that we have a secure connection.  By engaging in this virtual visit, you consent to the provision of healthcare and authorize for your insurance to be billed (if applicable) for the services provided during this visit. Depending on your insurance coverage, you may receive a charge related to this service.  I need to obtain your verbal consent now. Are you willing to proceed with your visit today? Bryan Ochoa has provided verbal consent on 07/21/2023 for a virtual visit (video or telephone). Laure Kidney, [place]  Date: 07/21/2023 11:39 AM  Virtual Visit via Video Note   I, Laure Kidney, connected with  Bryan Ochoa  ([id number], 60-07-1961) on 07/21/23 at 11:30 AM EST by a video-enabled telemedicine application and verified that I am speaking with the correct person using two identifiers.  Location: Patient: Virtual Visit Location Patient:  Home Provider: Virtual Visit Location Provider: Home Office   I discussed the limitations of evaluation and management by telemedicine and the availability of in person appointments. The patient expressed understanding and agreed to proceed.    History of Present Illness: Bryan Ochoa is a 62 y.o. who identifies as a male who was assigned male at birth, and is being seen today for sinusitis.  HPI: Sinusitis This is a recurrent problem. The current episode started in the past 7 days. The maximum temperature recorded prior to his arrival was 100.4 - 100.9 F. The fever has been present for Less than 1 day. Associated symptoms include congestion, coughing, headaches, sinus pressure and sneezing. Past treatments include saline nose sprays. The treatment provided mild relief.    Problems:  Patient Active Problem List   Diagnosis Date Noted   Prediabetes 08/30/2022   Chronic hepatitis C (HCC) 12/01/2021   Serum calcium elevated 04/10/2019   Restless legs 02/28/2018   Chronic pansinusitis 06/11/2017   Deviated nasal septum 06/11/2017   Hypertrophy of inferior nasal turbinate 06/11/2017   Nasal obstruction 06/11/2017   Nocturia 04/24/2017   Recurrent sinusitis 09/05/2016   Depression, recurrent (HCC) 10/26/2015   GERD (gastroesophageal reflux disease) 10/26/2015   Mixed hyperlipidemia 10/26/2015   Obesity 09/29/2014   Snoring 09/29/2014   Cerebral infarction (HCC) 09/29/2014   Sleep apnea 06/19/2014   Chronic migraine without aura without status migrainosus, not intractable 06/19/2014   Essential hypertension 05/12/2014   Midline low back pain without sciatica 05/12/2014  Allergies:  Allergies  Allergen Reactions   Penicillins Other (See Comments)    Knots all over body. Other reaction(s): Other (See Comments) Knots all over body. Knots all over body.   Medications:  Current Outpatient Medications:    azithromycin (ZITHROMAX) 250 MG tablet, Take 2 tablets on day 1, then 1  tablet daily on days 2 through 5, Disp: 6 tablet, Rfl: 0   fluticasone (FLONASE) 50 MCG/ACT nasal spray, Place 2 sprays into both nostrils daily., Disp: 16 g, Rfl: 6   atorvastatin (LIPITOR) 20 MG tablet, Take 1 tablet (20 mg total) by mouth daily., Disp: 90 tablet, Rfl: 1   buPROPion (WELLBUTRIN XL) 150 MG 24 hr tablet, Take 1 tablet (150 mg total) by mouth daily., Disp: 90 tablet, Rfl: 1   gabapentin (NEURONTIN) 600 MG tablet, Take 1 tablet (600 mg total) by mouth 3 (three) times daily. OR Take 1 tablet each morning and 2 tablets at bedtime., Disp: 270 tablet, Rfl: 1   losartan (COZAAR) 100 MG tablet, Take 1 tablet (100 mg total) by mouth daily., Disp: 90 tablet, Rfl: 1   omeprazole (PRILOSEC) 20 MG capsule, Take 1 capsule (20 mg total) by mouth daily., Disp: 90 capsule, Rfl: 0   Rimegepant Sulfate (NURTEC) 75 MG TBDP, Take 1 tablet by mouth x 1 dose for acute headache. (Max 75 mg/day), Disp: 16 tablet, Rfl: 0   sertraline (ZOLOFT) 100 MG tablet, Take 1.5 tablets (150 mg total) by mouth daily., Disp: 135 tablet, Rfl: 1   tamsulosin (FLOMAX) 0.4 MG CAPS capsule, Take 1 capsule (0.4 mg total) by mouth daily., Disp: 90 capsule, Rfl: 1  Observations/Objective: Patient is well-developed, well-nourished in no acute distress.  Resting comfortably  at home.  Head is normocephalic, atraumatic.  No labored breathing.  Speech is clear and coherent with logical content.  Patient is alert and oriented at baseline.    Assessment and Plan: 1. Acute recurrent sinusitis, unspecified location (Primary)   Presentation was consistent with sinusitis.  No evidence of other bacterial infections including pneumonia, meningitis, pharyngitis, otitis media, deep abscess or cavernous sinus Discussed that this fits the picture of  bacterial sinusitis and that due to type and duration of symptoms and exam findings, we will treat as bacterial sinusitis.  Antibiotics prescribed. Advised to continue ibuprofen and Tylenol  at home. .   Advised patient on supportive therapies I discussed with the patient, diagnosis, plan of care, medications and to follow up with the patient's primary physician. The patient was instructed to return if the worsens in any way, especially if not tolerating fluids, increased sinus pain or swelling, worsening headache, persistent fever, difficulty swallowing or breathing, or as needed. The patient agreed with the plan.   Follow Up Instructions: I discussed the assessment and treatment plan with the patient. The patient was provided an opportunity to ask questions and all were answered. The patient agreed with the plan and demonstrated an understanding of the instructions.  A copy of instructions were sent to the patient via MyChart unless otherwise noted below.    The patient was advised to call back or seek an in-person evaluation if the symptoms worsen or if the condition fails to improve as anticipated.    Laure Kidney, PA-C

## 2023-07-21 NOTE — Patient Instructions (Signed)
Bryan Ochoa, thank you for joining Laure Kidney, PA-C for today's virtual visit.  While this provider is not your primary care provider (PCP), if your PCP is located in our provider database this encounter information will be shared with them immediately following your visit.   A [facility] MyChart account gives you access to today's visit and all your visits, tests, and labs performed at [facility] " click here if you don't have a [facility] MyChart account or go to [email or web address]  Consent: (Patient) Bryan Ochoa provided verbal consent for this virtual visit at the beginning of the encounter.  Current Medications:  Current Outpatient Medications:    azithromycin (ZITHROMAX) 250 MG tablet, Take 2 tablets on day 1, then 1 tablet daily on days 2 through 5, Disp: 6 tablet, Rfl: 0   fluticasone (FLONASE) 50 MCG/ACT nasal spray, Place 2 sprays into both nostrils daily., Disp: 16 g, Rfl: 6   atorvastatin (LIPITOR) 20 MG tablet, Take 1 tablet (20 mg total) by mouth daily., Disp: 90 tablet, Rfl: 1   buPROPion (WELLBUTRIN XL) 150 MG 24 hr tablet, Take 1 tablet (150 mg total) by mouth daily., Disp: 90 tablet, Rfl: 1   gabapentin (NEURONTIN) 600 MG tablet, Take 1 tablet (600 mg total) by mouth 3 (three) times daily. OR Take 1 tablet each morning and 2 tablets at bedtime., Disp: 270 tablet, Rfl: 1   losartan (COZAAR) 100 MG tablet, Take 1 tablet (100 mg total) by mouth daily., Disp: 90 tablet, Rfl: 1   omeprazole (PRILOSEC) 20 MG capsule, Take 1 capsule (20 mg total) by mouth daily., Disp: 90 capsule, Rfl: 0   Rimegepant Sulfate (NURTEC) 75 MG TBDP, Take 1 tablet by mouth x 1 dose for acute headache. (Max 75 mg/day), Disp: 16 tablet, Rfl: 0   sertraline (ZOLOFT) 100 MG tablet, Take 1.5 tablets (150 mg total) by mouth daily., Disp: 135 tablet, Rfl: 1   tamsulosin (FLOMAX) 0.4 MG CAPS capsule, Take 1 capsule (0.4 mg total) by mouth daily., Disp: 90 capsule, Rfl: 1    Medications ordered in this encounter:  Meds ordered this encounter  Medications   azithromycin (ZITHROMAX) 250 MG tablet    Sig: Take 2 tablets on day 1, then 1 tablet daily on days 2 through 5    Dispense:  6 tablet    Refill:  0    Supervising Provider:   Merrilee Jansky [[id number]]   fluticasone (FLONASE) 50 MCG/ACT nasal spray    Sig: Place 2 sprays into both nostrils daily.    Dispense:  16 g    Refill:  6    Supervising Provider:   Merrilee Jansky [[id number]]     *If you need refills on other medications prior to your next appointment, please contact your pharmacy*  Follow-Up: Call back or seek an in-person evaluation if the symptoms worsen or if the condition fails to improve as anticipated.  [facility] [phone]  Other Instructions Take medications as prescribed. If your symptoms don't improve in 2-3 days, please follow up with your primary provider.    If you have been instructed to have an in-person evaluation today at a local Urgent Care facility, please use the link below. It will take you to a list of all of our available [facility], including address, phone number and hours of operation. Please do not delay care.   Urgent Cares  If you or a family member do not have  a primary care provider, use the link below to schedule a visit and establish care. When you choose a [facility] primary care physician or advanced practice provider, you gain a long-term partner in health. Find a Primary Care Provider  Learn more about [place]'s in-office and virtual care options: [facility] - Get Care Now

## 2023-08-02 ENCOUNTER — Other Ambulatory Visit: Payer: Self-pay | Admitting: Family Medicine

## 2023-08-02 DIAGNOSIS — G43709 Chronic migraine without aura, not intractable, without status migrainosus: Secondary | ICD-10-CM

## 2023-08-02 DIAGNOSIS — R351 Nocturia: Secondary | ICD-10-CM

## 2023-08-15 ENCOUNTER — Other Ambulatory Visit: Payer: Self-pay | Admitting: Family Medicine

## 2023-08-30 ENCOUNTER — Ambulatory Visit: Payer: Federal, State, Local not specified - PPO | Admitting: Family Medicine

## 2023-09-02 ENCOUNTER — Other Ambulatory Visit: Payer: Self-pay | Admitting: Family Medicine

## 2023-09-02 DIAGNOSIS — F339 Major depressive disorder, recurrent, unspecified: Secondary | ICD-10-CM

## 2023-09-03 ENCOUNTER — Other Ambulatory Visit: Payer: Self-pay | Admitting: Family Medicine

## 2023-09-03 DIAGNOSIS — K219 Gastro-esophageal reflux disease without esophagitis: Secondary | ICD-10-CM

## 2023-09-13 ENCOUNTER — Encounter: Payer: Self-pay | Admitting: Family Medicine

## 2023-11-08 DIAGNOSIS — H04562 Stenosis of left lacrimal punctum: Secondary | ICD-10-CM | POA: Diagnosis not present

## 2023-11-08 DIAGNOSIS — H02831 Dermatochalasis of right upper eyelid: Secondary | ICD-10-CM | POA: Diagnosis not present

## 2023-11-08 DIAGNOSIS — H04123 Dry eye syndrome of bilateral lacrimal glands: Secondary | ICD-10-CM | POA: Diagnosis not present

## 2023-11-08 DIAGNOSIS — H25813 Combined forms of age-related cataract, bilateral: Secondary | ICD-10-CM | POA: Diagnosis not present

## 2023-11-30 ENCOUNTER — Other Ambulatory Visit: Payer: Self-pay | Admitting: Family Medicine

## 2023-11-30 DIAGNOSIS — K219 Gastro-esophageal reflux disease without esophagitis: Secondary | ICD-10-CM

## 2023-12-21 ENCOUNTER — Other Ambulatory Visit: Payer: Self-pay | Admitting: Family Medicine

## 2023-12-21 DIAGNOSIS — E782 Mixed hyperlipidemia: Secondary | ICD-10-CM

## 2024-01-13 ENCOUNTER — Other Ambulatory Visit: Payer: Self-pay | Admitting: Family Medicine

## 2024-01-13 DIAGNOSIS — I10 Essential (primary) hypertension: Secondary | ICD-10-CM

## 2024-01-14 ENCOUNTER — Other Ambulatory Visit: Payer: Self-pay | Admitting: Family Medicine

## 2024-01-14 DIAGNOSIS — E782 Mixed hyperlipidemia: Secondary | ICD-10-CM

## 2024-01-17 ENCOUNTER — Ambulatory Visit (INDEPENDENT_AMBULATORY_CARE_PROVIDER_SITE_OTHER)

## 2024-01-17 ENCOUNTER — Other Ambulatory Visit: Payer: Self-pay | Admitting: Family Medicine

## 2024-01-17 VITALS — BP 157/105 | HR 97 | Ht 72.0 in | Wt 259.1 lb

## 2024-01-17 DIAGNOSIS — H6690 Otitis media, unspecified, unspecified ear: Secondary | ICD-10-CM | POA: Insufficient documentation

## 2024-01-17 DIAGNOSIS — F339 Major depressive disorder, recurrent, unspecified: Secondary | ICD-10-CM

## 2024-01-17 DIAGNOSIS — G2581 Restless legs syndrome: Secondary | ICD-10-CM | POA: Diagnosis not present

## 2024-01-17 MED ORDER — DOXYCYCLINE HYCLATE 100 MG PO TABS: 100.0000 mg | ORAL_TABLET | Freq: Two times a day (BID) | ORAL | 0 refills | Status: AC

## 2024-01-17 MED ORDER — BUPROPION HCL ER (XL) 300 MG PO TB24: 300.0000 mg | ORAL_TABLET | Freq: Every day | ORAL | 1 refills | Status: AC

## 2024-01-17 NOTE — Progress Notes (Signed)
 Established Patient Office Visit  Subjective   Patient ID: Bryan Ochoa, male    DOB: 03-22-1961  Age: 63 y.o. MRN: [id number]  Chief Complaint  Patient presents with   Medical Management of Chronic Issues    HPI  Bryan Ochoa is a 63 y.o. male who presents to the clinic today for follow up on restless legs and mood.   Restless legs: At his last visit with Britta Candy, they titrated his gabapentin  dose to 600 mg 3 times daily OR gabapentin  600 mg every morning and 1200 mg at bedtime.  Patient reports that he thought this was supposed to be taken on as as needed basis and so has not been taking it consistently. Reports that his symptoms are no better. Endorses sharp electric pains in his legs at night, denies numbness or tingling.    Mood:Pt currently on bupropion  150 mg daily consistently, increased Zoloft  to 150 mg daily 2 months ago. Patient reports that he believes the medication is helping to stabilize his mood and prevent angry outbursts, however does not think the dose it strong enough because he is still having several agitated outbursts at his job.  It was discussed at his last visit with Britta Candy the option of referral to psychiatry for GeneSight testing and more focused treatment. Patient reports that he would like to think about counseling/therapy for a few more weeks.   Congestion: Patient reports a 4-5 day history of runny nose, facial pain, productive cough, and fullness in his ears. Denies sick contacts or recent travel. Denies CP, fevers, wheezing, SOB.     ROS Per HPI.    Objective:     BP (!) 157/105   Pulse 97   Ht 6' (1.829 m)   Wt 259 lb 1.9 oz (117.5 kg)   SpO2 94%   BMI 35.14 kg/m    Physical Exam Constitutional:      General: He is not in acute distress.    Appearance: Normal appearance.  HENT:     Right Ear: Ear canal and external ear normal. Tympanic membrane is erythematous and bulging.     Left Ear: Ear canal and external ear normal. A middle  ear effusion is present.     Nose: Congestion and rhinorrhea present.     Right Sinus: Frontal sinus tenderness present.     Left Sinus: Frontal sinus tenderness present.   Cardiovascular:     Rate and Rhythm: Normal rate and regular rhythm.     Heart sounds: Normal heart sounds. No murmur heard.    No friction rub. No gallop.  Pulmonary:     Effort: Pulmonary effort is normal. No respiratory distress.     Breath sounds: Normal breath sounds.   Musculoskeletal:        General: No swelling.   Skin:    General: Skin is warm and dry.   Neurological:     General: No focal deficit present.     Mental Status: He is alert.   Psychiatric:        Mood and Affect: Mood normal.        Behavior: Behavior normal.        Thought Content: Thought content normal.     No results found for any visits on 01/17/24.    The 10-year ASCVD risk score (Arnett DK, et al., 2019) is: 19.8%    Assessment & Plan:   Acute otitis media, unspecified otitis media type Assessment & Plan: Start doxycycline  100 mg twice  a day for 7 days for right-sided ear infection.  Advised patient to take this medication with food to avoid GI upset.  Recommend to continue Sudafed or over-the-counter Mucinex to help with symptomatic treatment.  Advised patient that if symptoms worsen or do not improve with the course of antibiotics to follow-up for reevaluation.   Depression, recurrent (HCC) Assessment & Plan: PHQ-9 score has improved from a 9 to a 6 today.  Continue Zoloft  150 mg as prescribed.  Increase bupropion  to 300 mg daily instead of 150.  Brought up the previous discussion the patient had with Britta Candy regarding referral to psychiatry.  Patient would like to hold off on that referral to see if the increased dose of medication works first.  Will follow-up on mood in 3 months.   Restless legs Assessment & Plan: Advised patient to start taking his gabapentin  600 mg consistently as prescribed instead of as needed  to control RLS symptoms.  Patient verbalized understanding and was in agreement with this plan.  We discussed that if his symptoms do not improve, we can refer him for a nerve conduction study and discuss Lyrica as an alternative treatment.  Will continue to monitor.   Other orders -     Doxycycline  Hyclate; Take 1 tablet (100 mg total) by mouth 2 (two) times daily for 7 days.  Dispense: 14 tablet; Refill: 0 -     buPROPion  HCl ER (XL); Take 1 tablet (300 mg total) by mouth daily.  Dispense: 90 tablet; Refill: 1    Return in about 3 months (around 04/18/2024) for Mood.    Odilia Bennett, PA-C

## 2024-01-17 NOTE — Patient Instructions (Signed)
 It was nice to see you today!  As we discussed in clinic:  - I recommend to start taking your 600 mg of gabapentin  at night before bed consistently for at least 2 weeks instead of on an as-needed basis. - I have also increased your Wellbutrin  from 150 mg to 300 mg.  You can take 2 of the 150 mg tablets that you have left, then pick up your 300 mg tablets to take in its place. - I have also sent in a course of doxycycline  for your ear infection/sinus infection.  Please take 2 of these pills a day with food to avoid stomach upset.  If your symptoms worsen or fail to improve after the course of antibiotics, please let us  know.  I will plan to see you back in 3 months for a follow-up on mood.  If you have any problems before your next visit feel free to message me via MyChart (minor issues or questions) or call the office, otherwise you may reach out to schedule an office visit.  Thank you! Meryl Acosta, PA-C

## 2024-01-17 NOTE — Assessment & Plan Note (Signed)
 Advised patient to start taking his gabapentin  600 mg consistently as prescribed instead of as needed to control RLS symptoms.  Patient verbalized understanding and was in agreement with this plan.  We discussed that if his symptoms do not improve, we can refer him for a nerve conduction study and discuss Lyrica as an alternative treatment.  Will continue to monitor.

## 2024-01-17 NOTE — Assessment & Plan Note (Signed)
 Start doxycycline  100 mg twice a day for 7 days for right-sided ear infection.  Advised patient to take this medication with food to avoid GI upset.  Recommend to continue Sudafed or over-the-counter Mucinex to help with symptomatic treatment.  Advised patient that if symptoms worsen or do not improve with the course of antibiotics to follow-up for reevaluation.

## 2024-01-17 NOTE — Assessment & Plan Note (Signed)
 PHQ-9 score has improved from a 9 to a 6 today.  Continue Zoloft  150 mg as prescribed.  Increase bupropion  to 300 mg daily instead of 150.  Brought up the previous discussion the patient had with Britta Candy regarding referral to psychiatry.  Patient would like to hold off on that referral to see if the increased dose of medication works first.  Will follow-up on mood in 3 months.

## 2024-01-22 ENCOUNTER — Other Ambulatory Visit: Payer: Self-pay | Admitting: Family Medicine

## 2024-01-22 DIAGNOSIS — R351 Nocturia: Secondary | ICD-10-CM

## 2024-02-24 ENCOUNTER — Other Ambulatory Visit: Payer: Self-pay | Admitting: Family Medicine

## 2024-02-24 DIAGNOSIS — F339 Major depressive disorder, recurrent, unspecified: Secondary | ICD-10-CM

## 2024-03-08 ENCOUNTER — Other Ambulatory Visit: Payer: Self-pay | Admitting: Family Medicine

## 2024-03-08 DIAGNOSIS — K219 Gastro-esophageal reflux disease without esophagitis: Secondary | ICD-10-CM

## 2024-04-18 ENCOUNTER — Ambulatory Visit

## 2024-04-18 VITALS — BP 142/78 | HR 80 | Temp 97.9°F | Ht 72.0 in | Wt 251.1 lb

## 2024-04-18 DIAGNOSIS — G2581 Restless legs syndrome: Secondary | ICD-10-CM

## 2024-04-18 DIAGNOSIS — F339 Major depressive disorder, recurrent, unspecified: Secondary | ICD-10-CM

## 2024-04-18 DIAGNOSIS — I10 Essential (primary) hypertension: Secondary | ICD-10-CM

## 2024-04-18 DIAGNOSIS — E782 Mixed hyperlipidemia: Secondary | ICD-10-CM

## 2024-04-18 MED ORDER — GABAPENTIN 300 MG PO CAPS: 300.0000 mg | ORAL_CAPSULE | Freq: Two times a day (BID) | ORAL | 3 refills | Status: AC

## 2024-04-18 MED ORDER — LOSARTAN POTASSIUM-HCTZ 100-12.5 MG PO TABS: 1.0000 | ORAL_TABLET | Freq: Every day | ORAL | 2 refills | Status: AC

## 2024-04-18 NOTE — Patient Instructions (Signed)
 VISIT SUMMARY: Today, we discussed your ongoing health concerns, including restless legs syndrome, anxiety, hypertension, and hyperlipidemia. We reviewed your current medications and made some adjustments to better manage your conditions.  YOUR PLAN: -HYPERTENSION: Hypertension means high blood pressure. We are changing your medication from losartan  alone to a combination of losartan  and hydrochlorothiazide. Please start the new medication the day after you pick it up and discard your old prescription. Check your blood pressure at home twice a week and record the readings in the provided log. We will reassess your blood pressure control in 8 weeks.  -DEPRESSION: Depression is a mood disorder that causes persistent feelings of sadness and loss of interest. Your depression is improving with your current medications. Continue taking Wellbutrin  (bupropion ) 300 mg daily and sertraline  150 mg daily. Monitor your mental health and report any worsening symptoms.  -RESTLESS LEGS SYNDROME: Restless legs syndrome is a condition that causes an uncontrollable urge to move your legs, usually due to an uncomfortable sensation. Start taking gabapentin  300 mg at night for one week. If you tolerate it well, increase the dose to 300 mg twice daily after one week. Follow the written instructions provided for dosing.  -GENERAL HEALTH MAINTENANCE: You are overdue for routine blood work, including a cholesterol check. Please schedule a fasting blood work appointment one week before your next follow-up to recheck your cholesterol levels.  INSTRUCTIONS: Please start your new hypertension medication the day after you pick it up and check your blood pressure at home twice a week. Record your readings in the provided log. Schedule a follow-up appointment in 8 weeks to reassess your blood pressure control. Also, schedule a fasting blood work appointment one week before your next follow-up to recheck your cholesterol levels.  If  you have any problems before your next visit feel free to message me via MyChart (minor issues or questions) or call the office, otherwise you may reach out to schedule an office visit.  Thank you! Saddie Sacks, PA-C

## 2024-04-18 NOTE — Assessment & Plan Note (Signed)
 BP Goal <130/80. Hypertension remains uncontrolled with losartan  monotherapy. - Discontinue losartan  monotherapy. - Initiate losartan /hydrochlorothiazide 100-12.5 mg combination therapy. - Instruct to start new medication the day after picking it up. - Check blood pressure at home twice weekly using an electric cuff. - Provide blood pressure log for home monitoring. - Schedule follow-up appointment in 8 weeks to reassess blood pressure control.

## 2024-04-18 NOTE — Assessment & Plan Note (Signed)
 Symptoms persist. Gabapentin  was prescribed but not started. - Initiate gabapentin  300 mg at night for one week. - Increase gabapentin  to 300 mg twice daily after one week if tolerated. - Provide written instructions for gabapentin  dosing.

## 2024-04-18 NOTE — Assessment & Plan Note (Signed)
 Patient reports he is taking his atorvastatin  20 mg daily. Has not had lipid panel checked in 1.5 years. Will update labs and adjust treatment as necessary.

## 2024-04-18 NOTE — Progress Notes (Signed)
 Established Patient Office Visit  Subjective   Patient ID: Bryan Ochoa, male    DOB: [birth year]  Age: 63 y.o. MRN: [id number]  Chief Complaint  Patient presents with   Medical Management of Chronic Issues    HPI  History of Present Illness   Bryan Ochoa is a 63 year old male with anxiety and hypertension who presents for medication management and follow-up.  Restless legs syndrome - Not taking gabapentin  since before last visit - Forgot to restart gabapentin  and waited for this appointment to discuss - Continues to experience symptoms of restless legs, worse at nighttime but bother him all day  Anxiety and mood symptoms - Taking bupropion  (Wellbutrin ), dose increased from 150 mg to 300 mg 3 months ago  - Anxiety improving with no side effects from bupropion  - Continues sertraline  150 mg daily as prescribed - History of significant emotional distress following the loss of his son, with passive thoughts of wanting to 'go be with that boy' but denies current suicidal ideation - Mental health has improved recently, stating 'I've done a lot better than I've done in a long time'  Hypertension - Taking losartan  100 mg daily - Does not check blood pressure at home but has equipment available - Denies CP, SOB, Palpitations, vision changes, HA, or edema.       ROS Per HPI.    Objective:     BP (!) 142/78   Pulse 80   Temp 97.9 F (36.6 C) (Oral)   Ht 6' (1.829 m)   Wt 251 lb 1.9 oz (113.9 kg)   SpO2 96%   BMI 34.06 kg/m    Physical Exam Constitutional:      General: He is not in acute distress.    Appearance: Normal appearance.  Cardiovascular:     Rate and Rhythm: Normal rate and regular rhythm.     Heart sounds: Normal heart sounds. No murmur heard.    No friction rub. No gallop.  Pulmonary:     Effort: Pulmonary effort is normal. No respiratory distress.     Breath sounds: Normal breath sounds.  Musculoskeletal:        General: No swelling.   Skin:    General: Skin is warm and dry.  Neurological:     General: No focal deficit present.     Mental Status: He is alert.  Psychiatric:        Mood and Affect: Mood normal.        Behavior: Behavior normal.        Thought Content: Thought content normal.    No results found for any visits on 04/18/24.    The 10-year ASCVD risk score (Arnett DK, et al., 2019) is: 16.8%    Assessment & Plan:   Essential hypertension Assessment & Plan: BP Goal <130/80. Hypertension remains uncontrolled with losartan  monotherapy. - Discontinue losartan  monotherapy. - Initiate losartan /hydrochlorothiazide 100-12.5 mg combination therapy. - Instruct to start new medication the day after picking it up. - Check blood pressure at home twice weekly using an electric cuff. - Provide blood pressure log for home monitoring. - Schedule follow-up appointment in 8 weeks to reassess blood pressure control.   Depression, recurrent (HCC) Assessment & Plan: PHQ-9 stable at 6. Depression improving with current medication regimen. Wellbutrin  increased to 300 mg for anxiety with positive response and no side effects. Continues on sertraline  150 mg daily. No suicidal ideation or severe depressive symptoms. - Continue Wellbutrin  (bupropion ) 300 mg daily. -  Continue sertraline  150 mg daily. - Monitor mental health status and report any worsening symptoms.   Restless legs Assessment & Plan: Symptoms persist. Gabapentin  was prescribed but not started. - Initiate gabapentin  300 mg at night for one week. - Increase gabapentin  to 300 mg twice daily after one week if tolerated. - Provide written instructions for gabapentin  dosing.   Mixed hyperlipidemia Assessment & Plan: Patient reports he is taking his atorvastatin  20 mg daily. Has not had lipid panel checked in 1.5 years. Will update labs and adjust treatment as necessary.   Other orders -     Gabapentin ; Take 1 capsule (300 mg total) by mouth 2 (two)  times daily.  Dispense: 60 capsule; Refill: 3 -     Losartan  Potassium-HCTZ; Take 1 tablet by mouth daily.  Dispense: 90 tablet; Refill: 2    Return in about 8 weeks (around 06/13/2024) for HTN, HLD, restless legs.    Saddie JULIANNA Sacks, PA-C

## 2024-04-18 NOTE — Assessment & Plan Note (Signed)
 PHQ-9 stable at 6. Depression improving with current medication regimen. Wellbutrin  increased to 300 mg for anxiety with positive response and no side effects. Continues on sertraline  150 mg daily. No suicidal ideation or severe depressive symptoms. - Continue Wellbutrin  (bupropion ) 300 mg daily. - Continue sertraline  150 mg daily. - Monitor mental health status and report any worsening symptoms.

## 2024-06-01 ENCOUNTER — Other Ambulatory Visit: Payer: Self-pay

## 2024-06-01 DIAGNOSIS — E782 Mixed hyperlipidemia: Secondary | ICD-10-CM

## 2024-06-01 DIAGNOSIS — R7303 Prediabetes: Secondary | ICD-10-CM

## 2024-06-01 DIAGNOSIS — I10 Essential (primary) hypertension: Secondary | ICD-10-CM

## 2024-06-05 ENCOUNTER — Other Ambulatory Visit

## 2024-06-05 DIAGNOSIS — R7303 Prediabetes: Secondary | ICD-10-CM

## 2024-06-05 DIAGNOSIS — E782 Mixed hyperlipidemia: Secondary | ICD-10-CM

## 2024-06-05 DIAGNOSIS — I10 Essential (primary) hypertension: Secondary | ICD-10-CM

## 2024-06-12 ENCOUNTER — Ambulatory Visit

## 2024-06-12 VITALS — BP 137/85 | HR 71 | Temp 98.7°F | Ht 72.0 in | Wt 252.1 lb

## 2024-06-12 DIAGNOSIS — G2581 Restless legs syndrome: Secondary | ICD-10-CM | POA: Diagnosis not present

## 2024-06-12 DIAGNOSIS — E782 Mixed hyperlipidemia: Secondary | ICD-10-CM

## 2024-06-12 DIAGNOSIS — Z1212 Encounter for screening for malignant neoplasm of rectum: Secondary | ICD-10-CM

## 2024-06-12 DIAGNOSIS — Z1211 Encounter for screening for malignant neoplasm of colon: Secondary | ICD-10-CM | POA: Diagnosis not present

## 2024-06-12 DIAGNOSIS — I10 Essential (primary) hypertension: Secondary | ICD-10-CM | POA: Diagnosis not present

## 2024-06-12 DIAGNOSIS — B182 Chronic viral hepatitis C: Secondary | ICD-10-CM | POA: Diagnosis not present

## 2024-06-12 NOTE — Progress Notes (Signed)
 Established Patient Office Visit  Subjective   Patient ID: Bryan Ochoa, male    DOB: [birth year]  Age: 63 y.o. MRN: [id number]  Chief Complaint  Patient presents with   Medical Management of Chronic Issues    HPI   History of Present Illness   Bryan Ochoa is a 63 year old male with hypertension who presents for a follow-up visit to check on blood pressure and restless leg syndrome.  Restless leg syndrome - Significant symptoms at night - Gabapentin  300 mg twice daily is not effective - Frequently skips nighttime dose due to feeling overwhelmed by number of medications so he reports that he believes his restless leg would get much better if he was compliant with his gabapentin  as prescribed.  Hypertension - Not monitoring blood pressure at home - Recently started low dose hydrochlorothiazide (12.5 mg) to his losartan  - No side effects from hydrochlorothiazide - Took blood pressure medication and a 'goody powder' right before his visit  - Home blood pressure monitor gives unreliable readings; plans to purchase a new device  Colorectal cancer screening - Uses Cologuard test for screening - Due for another Cologuard test           ROS Per HPI.    Objective:     BP 137/85   Pulse 71   Temp 98.7 F (37.1 C) (Oral)   Ht 6' (1.829 m)   Wt 252 lb 1.9 oz (114.4 kg)   SpO2 96%   BMI 34.19 kg/m    Physical Exam Constitutional:      General: He is not in acute distress.    Appearance: Normal appearance.  Cardiovascular:     Rate and Rhythm: Normal rate and regular rhythm.     Heart sounds: Normal heart sounds. No murmur heard.    No friction rub. No gallop.  Pulmonary:     Effort: Pulmonary effort is normal. No respiratory distress.     Breath sounds: Normal breath sounds.  Musculoskeletal:        General: No swelling.  Skin:    General: Skin is warm and dry.  Neurological:     General: No focal deficit present.     Mental Status: He is  alert.  Psychiatric:        Mood and Affect: Mood normal.        Behavior: Behavior normal.        Thought Content: Thought content normal.      No results found for any visits on 06/12/24.    The 10-year ASCVD risk score (Arnett DK, et al., 2019) is: 15.8%    Assessment & Plan:   Encounter for colorectal cancer screening using Cologuard test -     Cologuard  Chronic hepatitis C without hepatic coma (HCC) Assessment & Plan: Follows with gastroenterology.  GI gave permission to resume statin therapy in 2024.  Continue regular follow-up with GI as scheduled.   Essential hypertension Assessment & Plan: BP goal <130/80.  At last visit in September, added hydrochlorothiazide 12.5 mg to his pre-existing 100 mg losartan .  Patient reports that he has been taking the medication daily.  Denies side effects.  Has not been checking blood pressure at home and did not bring his blood pressure log with him to his visit.  Blood pressure elevated in office today, improved on recheck but still not at goal.  Advised him to invest in a new blood pressure cuff and send readings via MyChart over the  next 2 weeks.  Patient agreeable.  If blood pressure is still not at goal by next visit, consider adding amlodipine 5 mg to his regimen.   Restless legs Assessment & Plan: Symptoms persist however patient admits that he has not been compliant with his gabapentin  300 mg.  Reports that he knows he is supposed to take gabapentin  300 mg at bedtime, however reports that he is overwhelmed with the amount of pills he takes at bedtime and sometimes skips the gabapentin .  Patient is agreeable to continuing the gabapentin  300 mg nightly without missing doses so that we can reassess his restless leg symptoms.  If gabapentin  proves ineffective, consider adding Requip   Mixed hyperlipidemia Assessment & Plan: Patient did not come in last week for lab work as he reports he forgot.  Lipid panel has not been checked in  1.5 years.  Got him rescheduled for a fasting lab appointment to recheck lipid panel next week.  Continue with atorvastatin  20 mg daily for now     Return in about 12 weeks (around 09/04/2024) for HTN.    Saddie JULIANNA Sacks, PA-C

## 2024-06-12 NOTE — Assessment & Plan Note (Signed)
 Follows with gastroenterology.  GI gave permission to resume statin therapy in 2024.  Continue regular follow-up with GI as scheduled.

## 2024-06-12 NOTE — Assessment & Plan Note (Signed)
 BP goal <130/80.  At last visit in September, added hydrochlorothiazide 12.5 mg to his pre-existing 100 mg losartan .  Patient reports that he has been taking the medication daily.  Denies side effects.  Has not been checking blood pressure at home and did not bring his blood pressure log with him to his visit.  Blood pressure elevated in office today, improved on recheck but still not at goal.  Advised him to invest in a new blood pressure cuff and send readings via MyChart over the next 2 weeks.  Patient agreeable.  If blood pressure is still not at goal by next visit, consider adding amlodipine 5 mg to his regimen.

## 2024-06-12 NOTE — Patient Instructions (Signed)
 VISIT SUMMARY: Today, we reviewed your blood pressure and restless leg syndrome. We also discussed your cholesterol levels and colorectal cancer screening.  YOUR PLAN: HYPERTENSION: Your blood pressure has improved with your current medications, losartan  and hydrochlorothiazide. We discussed the possibility of adding another medication if needed. -Rechecked your blood pressure before you left the clinic. -Consider adding amlodipine if your blood pressure remains elevated. -Encouraged you to monitor your blood pressure at home. -Scheduled labs next week to check your cholesterol levels.  RESTLESS LEGS SYNDROME: Your current medication, gabapentin , is not effective due to inconsistent dosing. -Encouraged you to take gabapentin  consistently. -Consider an alternative medication if there is no improvement.  HYPERLIPIDEMIA: We need to monitor your cholesterol levels to see how well atorvastatin  is working. -Scheduled labs next week to check your cholesterol levels.  COLORECTAL CANCER SCREENING: You are due for another Cologuard test for colorectal cancer screening. -Please complete your Cologuard test as soon as possible.  If you have any problems before your next visit feel free to message me via MyChart (minor issues or questions) or call the office, otherwise you may reach out to schedule an office visit.  Thank you! Saddie Sacks, PA-C

## 2024-06-12 NOTE — Assessment & Plan Note (Signed)
 Symptoms persist however patient admits that he has not been compliant with his gabapentin  300 mg.  Reports that he knows he is supposed to take gabapentin  300 mg at bedtime, however reports that he is overwhelmed with the amount of pills he takes at bedtime and sometimes skips the gabapentin .  Patient is agreeable to continuing the gabapentin  300 mg nightly without missing doses so that we can reassess his restless leg symptoms.  If gabapentin  proves ineffective, consider adding Requip

## 2024-06-12 NOTE — Assessment & Plan Note (Signed)
 Patient did not come in last week for lab work as he reports he forgot.  Lipid panel has not been checked in 1.5 years.  Got him rescheduled for a fasting lab appointment to recheck lipid panel next week.  Continue with atorvastatin  20 mg daily for now

## 2024-06-13 ENCOUNTER — Other Ambulatory Visit: Payer: Self-pay

## 2024-06-13 DIAGNOSIS — K219 Gastro-esophageal reflux disease without esophagitis: Secondary | ICD-10-CM

## 2024-06-19 ENCOUNTER — Other Ambulatory Visit

## 2024-06-19 DIAGNOSIS — I10 Essential (primary) hypertension: Secondary | ICD-10-CM | POA: Diagnosis not present

## 2024-06-19 DIAGNOSIS — E782 Mixed hyperlipidemia: Secondary | ICD-10-CM | POA: Diagnosis not present

## 2024-06-19 DIAGNOSIS — R7303 Prediabetes: Secondary | ICD-10-CM | POA: Diagnosis not present

## 2024-06-20 ENCOUNTER — Ambulatory Visit: Payer: Self-pay | Admitting: Family Medicine

## 2024-06-20 LAB — CBC WITH DIFFERENTIAL/PLATELET
Basophils Absolute: 0.1 x10E3/uL (ref 0.0–0.2)
Basos: 1 %
EOS (ABSOLUTE): 0.2 x10E3/uL (ref 0.0–0.4)
Eos: 3 %
Hematocrit: 49 % (ref 37.5–51.0)
Hemoglobin: 16 g/dL (ref 13.0–17.7)
Immature Grans (Abs): 0 x10E3/uL (ref 0.0–0.1)
Immature Granulocytes: 0 %
Lymphocytes Absolute: 2.5 x10E3/uL (ref 0.7–3.1)
Lymphs: 35 %
MCH: 29.7 pg (ref 26.6–33.0)
MCHC: 32.7 g/dL (ref 31.5–35.7)
MCV: 91 fL (ref 79–97)
Monocytes Absolute: 0.7 x10E3/uL (ref 0.1–0.9)
Monocytes: 9 %
Neutrophils Absolute: 3.7 x10E3/uL (ref 1.4–7.0)
Neutrophils: 52 %
Platelets: 292 x10E3/uL (ref 150–450)
RBC: 5.39 x10E6/uL (ref 4.14–5.80)
RDW: 13.1 % (ref 11.6–15.4)
WBC: 7.2 x10E3/uL (ref 3.4–10.8)

## 2024-06-20 LAB — COMPREHENSIVE METABOLIC PANEL WITH GFR
ALT: 17 IU/L (ref 0–44)
AST: 20 IU/L (ref 0–40)
Albumin: 4.6 g/dL (ref 3.9–4.9)
Alkaline Phosphatase: 97 IU/L (ref 47–123)
BUN/Creatinine Ratio: 15 (ref 10–24)
BUN: 13 mg/dL (ref 8–27)
Bilirubin Total: 0.4 mg/dL (ref 0.0–1.2)
CO2: 19 mmol/L — ABNORMAL LOW (ref 20–29)
Calcium: 9.5 mg/dL (ref 8.6–10.2)
Chloride: 105 mmol/L (ref 96–106)
Creatinine, Ser: 0.88 mg/dL (ref 0.76–1.27)
Globulin, Total: 2.3 g/dL (ref 1.5–4.5)
Glucose: 103 mg/dL — ABNORMAL HIGH (ref 70–99)
Potassium: 4.2 mmol/L (ref 3.5–5.2)
Sodium: 140 mmol/L (ref 134–144)
Total Protein: 6.9 g/dL (ref 6.0–8.5)
eGFR: 98 mL/min/1.73 (ref >59)

## 2024-06-20 LAB — LIPID PANEL
Chol/HDL Ratio: 3.4 ratio (ref 0.0–5.0)
Cholesterol, Total: 148 mg/dL (ref 100–199)
HDL: 43 mg/dL (ref >39)
LDL Chol Calc (NIH): 86 mg/dL (ref 0–99)
Triglycerides: 100 mg/dL (ref 0–149)
VLDL Cholesterol Cal: 19 mg/dL (ref 5–40)

## 2024-06-20 LAB — TSH: TSH: 2.87 u[IU]/mL (ref 0.450–4.500)

## 2024-06-20 LAB — HEMOGLOBIN A1C
Est. average glucose Bld gHb Est-mCnc: 131 mg/dL
Hgb A1c MFr Bld: 6.2 % — ABNORMAL HIGH (ref 4.8–5.6)

## 2024-06-20 LAB — VITAMIN D 25 HYDROXY (VIT D DEFICIENCY, FRACTURES): Vit D, 25-Hydroxy: 34.3 ng/mL (ref 30.0–100.0)

## 2024-07-11 ENCOUNTER — Other Ambulatory Visit: Payer: Self-pay

## 2024-07-11 DIAGNOSIS — E782 Mixed hyperlipidemia: Secondary | ICD-10-CM

## 2024-07-11 DIAGNOSIS — R351 Nocturia: Secondary | ICD-10-CM

## 2024-08-17 ENCOUNTER — Other Ambulatory Visit: Payer: Self-pay

## 2024-08-17 DIAGNOSIS — F339 Major depressive disorder, recurrent, unspecified: Secondary | ICD-10-CM

## 2024-09-04 ENCOUNTER — Ambulatory Visit

## 2024-09-08 ENCOUNTER — Other Ambulatory Visit: Payer: Self-pay

## 2024-09-08 DIAGNOSIS — K219 Gastro-esophageal reflux disease without esophagitis: Secondary | ICD-10-CM

## 2024-11-27 ENCOUNTER — Other Ambulatory Visit

## 2024-12-04 ENCOUNTER — Encounter
